# Patient Record
Sex: Female | Born: 1985 | Race: Asian | Hispanic: No | Marital: Married | Smoking: Never smoker
Health system: Southern US, Community
[De-identification: ages and names within clinical notes are randomized; demographics above are authoritative.]

---

## 2010-10-14 ENCOUNTER — Ambulatory Visit (HOSPITAL_COMMUNITY): Admission: RE | Admit: 2010-10-14 | Discharge: 2010-10-14 | Payer: Self-pay | Admitting: Obstetrics

## 2010-12-15 ENCOUNTER — Ambulatory Visit (HOSPITAL_COMMUNITY)
Admission: RE | Admit: 2010-12-15 | Discharge: 2010-12-15 | Payer: Self-pay | Source: Home / Self Care | Attending: Obstetrics | Admitting: Obstetrics

## 2011-01-05 ENCOUNTER — Ambulatory Visit (HOSPITAL_COMMUNITY)
Admission: RE | Admit: 2011-01-05 | Discharge: 2011-01-05 | Payer: Self-pay | Source: Home / Self Care | Attending: Obstetrics | Admitting: Obstetrics

## 2011-01-18 ENCOUNTER — Inpatient Hospital Stay (HOSPITAL_COMMUNITY)
Admission: AD | Admit: 2011-01-18 | Discharge: 2011-01-21 | DRG: 775 | Disposition: A | Discharge status: Home / Self Care | Payer: Medicaid Other | Source: Ambulatory Visit | Attending: Obstetrics & Gynecology | Admitting: Obstetrics & Gynecology

## 2011-01-18 DIAGNOSIS — Z22330 Carrier of Group B streptococcus: Secondary | ICD-10-CM

## 2011-01-18 DIAGNOSIS — O99892 Other specified diseases and conditions complicating childbirth: Secondary | ICD-10-CM | POA: Diagnosis present

## 2011-01-18 LAB — CBC
HCT: 35.6 % — ABNORMAL LOW (ref 36.0–46.0)
MCH: 27.5 pg (ref 26.0–34.0)
MCV: 80.9 fL (ref 78.0–100.0)
Platelets: 255 10*3/uL (ref 150–400)
RBC: 4.4 MIL/uL (ref 3.87–5.11)
RDW: 15.3 % (ref 11.5–15.5)

## 2011-01-20 LAB — CBC
Hemoglobin: 9.3 g/dL — ABNORMAL LOW (ref 12.0–15.0)
MCH: 27.5 pg (ref 26.0–34.0)
MCHC: 32.3 g/dL (ref 30.0–36.0)
MCV: 85.2 fL (ref 78.0–100.0)
RBC: 3.38 MIL/uL — ABNORMAL LOW (ref 3.87–5.11)

## 2011-01-21 ENCOUNTER — Inpatient Hospital Stay (HOSPITAL_COMMUNITY): Admission: AD | Admit: 2011-01-21 | Payer: Self-pay | Source: Home / Self Care | Admitting: Obstetrics

## 2011-01-31 NOTE — H&P (Signed)
  NAMELARISSA, PEGG NO.:  [id number]  MEDICAL RECORD NO.:  [id number]           PATIENT TYPE:  I  LOCATION:  9169                          FACILITY:  [facility]  PHYSICIAN:  Roseanna Rainbow, M.D.DATE OF BIRTH:  July 28, 1986  DATE OF ADMISSION:  01/18/2011 DATE OF DISCHARGE:                             HISTORY & PHYSICAL   CHIEF COMPLAINT:  The patient is a 25 year old gravida 1, para 0 with an estimated date of confinement of January 16, 2011 complaining of passing her mucus plug.  HISTORY OF PRESENT ILLNESS:  Please see the above.  ALLERGIES:  No known drug allergies.  PRENATAL LABORATORY DATA:  GBS positive on December 20, 2010.  Chlamydia probe negative.  Pap smear negative.  GC probe negative.  Sickle cell negative.  Hepatitis B surface antigen negative.  HIV nonreactive.  RPR nonreactive.  Rubella immune.  Blood type is O+.  Antibody screen negative.  Urine culture and sensitivity, greater than 100,000 colonies/mL, enterococcus on October 14, 2010.  Two-hour GTT normal. Hemoglobin 11.5, hematocrit 36.5, and platelets 285,000.  Ultrasound on October 14, 2010, the estimated fetal weight was at the 46th percentile for 26 weeks 1 day gestation.  Ultrasound on December 15, 2010, normal amniotic fluid index.  The estimated fetal weight was at the 41st percentile for a 35-week gestation.  Normal interval anatomy.  Umbilical artery Dopplers were normal.  BPP 8/8.  An ultrasound on January 05, 2011, normal amniotic fluid index.  The estimated fetal weight was at the 59th percentile for a 38-week gestation.  ALLERGIES:  No known drug allergies.  MEDICATIONS:  Please see the medication reconciliation form.  OB RISK FACTORS:  Urinary tract infection.  PAST GYN HISTORY:  Noncontributory.  PAST MEDICAL HISTORY:  No significant history of medical diseases.  PAST SURGICAL HISTORY:  No previous surgeries.  SOCIAL HISTORY:  She is a Art therapist.  She is  married, living with her spouse.  Not using alcohol, formally a minimal user.  Has no significant smoking history.  Denies illicit drug use.  FAMILY HISTORY:  Remarkable for adult-onset diabetes and hypertension.  REVIEW OF SYSTEMS:  GU:  Please see the above.  PHYSICAL EXAMINATION:  VITAL SIGNS:  Stable, afebrile.  The fetal heart tracing baseline 140, moderate to increased long-term variability, no decelerations, accelerations 15 x 15 noted.  Tocodynamometer, irregular uterine contractions. GENERAL:  No apparent distress. ABDOMEN:  Gravid. GENITOURINARY:  Sterile vaginal exam per the RN, the cervix is 6 cm dilated.  ASSESSMENT:  Nullipara at term, latent labor.  Category 1 fetal heart tracing.  GBS positive.  PLAN:  Admission, penicillin for GBS prophylaxis, monitor progress, possible augmentation of labor with Pitocin.     Roseanna Rainbow, M.D.     Judee Clara  D:  01/18/2011  T:  01/18/2011  Job:  [id number]  Electronically Signed by Antionette Char M.D. on 01/22/2011 10:27:04 AM

## 2011-07-20 ENCOUNTER — Ambulatory Visit
Admission: RE | Admit: 2011-07-20 | Discharge: 2011-07-20 | Disposition: A | Discharge status: Home / Self Care | Payer: Self-pay | Source: Ambulatory Visit | Attending: Infectious Diseases | Admitting: Infectious Diseases

## 2011-07-20 ENCOUNTER — Other Ambulatory Visit: Payer: Self-pay | Admitting: Infectious Diseases

## 2011-07-20 DIAGNOSIS — R7611 Nonspecific reaction to tuberculin skin test without active tuberculosis: Secondary | ICD-10-CM

## 2015-04-29 ENCOUNTER — Ambulatory Visit: Payer: Self-pay | Admitting: Obstetrics

## 2016-08-02 ENCOUNTER — Other Ambulatory Visit: Payer: Self-pay | Admitting: "Women's Health Care

## 2016-08-02 DIAGNOSIS — N644 Mastodynia: Secondary | ICD-10-CM

## 2016-08-07 ENCOUNTER — Ambulatory Visit
Admission: RE | Admit: 2016-08-07 | Discharge: 2016-08-07 | Disposition: A | Discharge status: Home / Self Care | Payer: Medicaid Other | Source: Ambulatory Visit | Attending: "Women's Health Care | Admitting: "Women's Health Care

## 2016-08-07 DIAGNOSIS — N644 Mastodynia: Secondary | ICD-10-CM

## 2016-11-16 ENCOUNTER — Other Ambulatory Visit (HOSPITAL_COMMUNITY): Payer: Self-pay | Admitting: Internal Medicine

## 2016-11-16 DIAGNOSIS — G44311 Acute post-traumatic headache, intractable: Secondary | ICD-10-CM

## 2016-11-24 ENCOUNTER — Ambulatory Visit (HOSPITAL_COMMUNITY): Payer: Medicaid Other

## 2016-12-01 ENCOUNTER — Ambulatory Visit (HOSPITAL_COMMUNITY)
Admission: RE | Admit: 2016-12-01 | Discharge: 2016-12-01 | Disposition: A | Discharge status: Home / Self Care | Payer: Medicaid Other | Source: Ambulatory Visit | Attending: Internal Medicine | Admitting: Internal Medicine

## 2016-12-01 DIAGNOSIS — G44311 Acute post-traumatic headache, intractable: Secondary | ICD-10-CM | POA: Diagnosis present

## 2016-12-07 ENCOUNTER — Emergency Department (HOSPITAL_COMMUNITY)
Admission: EM | Admit: 2016-12-07 | Discharge: 2016-12-07 | Disposition: A | Discharge status: Home / Self Care | Payer: Medicaid Other | Attending: Emergency Medicine | Admitting: Emergency Medicine

## 2016-12-07 ENCOUNTER — Emergency Department (HOSPITAL_COMMUNITY): Payer: Medicaid Other

## 2016-12-07 ENCOUNTER — Encounter (HOSPITAL_COMMUNITY): Payer: Self-pay

## 2016-12-07 DIAGNOSIS — R1310 Dysphagia, unspecified: Secondary | ICD-10-CM | POA: Diagnosis present

## 2016-12-07 DIAGNOSIS — Z79899 Other long term (current) drug therapy: Secondary | ICD-10-CM | POA: Insufficient documentation

## 2016-12-07 LAB — BASIC METABOLIC PANEL
Anion gap: 10 (ref 5–15)
BUN: 8 mg/dL (ref 6–20)
CALCIUM: 9.6 mg/dL (ref 8.9–10.3)
CO2: 24 mmol/L (ref 22–32)
Chloride: 104 mmol/L (ref 101–111)
Creatinine, Ser: 0.82 mg/dL (ref 0.44–1.00)
GFR calc Af Amer: >60 mL/min (ref >60)
GLUCOSE: 87 mg/dL (ref 65–99)
Potassium: 4.5 mmol/L (ref 3.5–5.1)
Sodium: 138 mmol/L (ref 135–145)

## 2016-12-07 LAB — CBC WITH DIFFERENTIAL/PLATELET
BASOS ABS: 0 10*3/uL (ref 0.0–0.1)
BASOS PCT: 0 %
EOS PCT: 0 %
Eosinophils Absolute: 0 10*3/uL (ref 0.0–0.7)
HCT: 38.2 % (ref 36.0–46.0)
Hemoglobin: 13.1 g/dL (ref 12.0–15.0)
LYMPHS PCT: 37 %
Lymphs Abs: 1.9 10*3/uL (ref 0.7–4.0)
MCH: 28.7 pg (ref 26.0–34.0)
MCHC: 34.3 g/dL (ref 30.0–36.0)
MCV: 83.6 fL (ref 78.0–100.0)
MONO ABS: 0.3 10*3/uL (ref 0.1–1.0)
MONOS PCT: 7 %
Neutro Abs: 2.9 10*3/uL (ref 1.7–7.7)
Neutrophils Relative %: 56 %
PLATELETS: 353 10*3/uL (ref 150–400)
RBC: 4.57 MIL/uL (ref 3.87–5.11)
RDW: 12.4 % (ref 11.5–15.5)
WBC: 5.1 10*3/uL (ref 4.0–10.5)

## 2016-12-07 MED ORDER — SODIUM CHLORIDE 0.9 % IV SOLN: Freq: Once | INTRAVENOUS | Status: DC

## 2016-12-07 MED ORDER — SODIUM CHLORIDE 0.9 % IV BOLUS (SEPSIS): 500.0000 mL | Freq: Once | INTRAVENOUS | Status: DC

## 2016-12-07 MED ORDER — SODIUM CHLORIDE 0.9 % IV BOLUS (SEPSIS): 1000.0000 mL | Freq: Once | INTRAVENOUS | Status: DC

## 2016-12-07 MED ORDER — IOPAMIDOL (ISOVUE-300) INJECTION 61%
INTRAVENOUS | Status: AC
  Filled 2016-12-07: qty 75

## 2016-12-07 MED ORDER — IOPAMIDOL (ISOVUE-300) INJECTION 61%: 75.0000 mL | Freq: Once | INTRAVENOUS | Status: DC | PRN

## 2016-12-07 NOTE — ED Provider Notes (Signed)
[facility]-EMERGENCY DEPT Provider Note   CSN: [id number] Arrival date & time: 12/07/16  1312  By signing my name below, I, [name], attest that this documentation has been prepared under the direction and in the presence of Shawn C. Joy, PA-C. Electronically Signed: [name], ED Scribe. 12/07/16. 2:07 PM.   History   Chief Complaint Chief Complaint  Patient presents with  . Dysphagia    HPI HPI Comments: Sherri Nguyen is a 30 y.o. female who presents to the Emergency Department complaining of mild dysphagia x 2 weeks. Pt states she was evaluated by her PCP yesterday and was dx with a cyst to her left anterior neck and was told she needs to have a barium swallow test performed. She reports mild non radiating pain in the region. Pt states she is able to swallow liquids w/o difficulty and has mild difficulty swallowing solid foods due to pain. She had a small amount of water this morning but otherwise has had no oral intake since last night. She has taken abx for 10 days which provided mild relief initially but none long term. She denies difficulty breathing, nausea, vomiting, fever, chills or other associated symptoms at this time.   PCP: Dr. Ardelle [facility]Haque at [facility]  The history is provided by the patient and medical records. No language interpreter was used.    History reviewed. No pertinent past medical history.  There are no active problems to display for this patient.   History reviewed. No pertinent surgical history.  OB History    No data available       Home Medications    Prior to Admission medications   Medication Sig Start Date End Date Taking? Authorizing Provider  amoxicillin-clavulanate (AUGMENTIN) 875-125 MG tablet Take 1 tablet by mouth 2 (two) times daily.   Yes Historical Provider, MD  Cholecalciferol (VITAMIN D-3) 5000 units TABS Take 5,000 Units by mouth daily.   Yes Historical Provider, MD  nystatin (MYCOSTATIN) 100000 UNIT/ML  suspension Take 4 mLs by mouth 4 (four) times daily.   Yes Historical Provider, MD  omeprazole (PRILOSEC) 20 MG capsule Take 20 mg by mouth daily as needed (heartburn).   Yes Historical Provider, MD  sulfamethoxazole-trimethoprim (BACTRIM DS,SEPTRA DS) 800-160 MG tablet Take 1 tablet by mouth 2 (two) times daily.   Yes Historical Provider, MD    Family History History reviewed. No pertinent family history.  Social History Social History  Substance Use Topics  . Smoking status: Never Smoker  . Smokeless tobacco: Never Used  . Alcohol use No     Allergies   Patient has no known allergies.   Review of Systems Review of Systems  Constitutional: Negative for chills and fever.  HENT: Positive for sore throat and trouble swallowing.   Respiratory: Negative for shortness of breath.   Gastrointestinal: Negative for nausea and vomiting.  All other systems reviewed and are negative.  Physical Exam Updated Vital Signs BP 122/71 (BP Location: Right Arm)   Pulse 88   Temp 98.2 F (36.8 C) (Oral)   Resp 18   Ht 5\' 6"  (1.676 m)   Wt 148 lb (67.1 kg)   LMP 12/06/2016   SpO2 100%   BMI 23.89 kg/m   Physical Exam  Constitutional: She appears well-developed and well-nourished. No distress.  HENT:  Head: Normocephalic and atraumatic.  Mouth/Throat: Oropharynx is clear and moist.  Oropharynx clear. Speaks in full sentences w/o difficulty. Handles oral secretions w/o difficulty.   Eyes: Conjunctivae are normal.  Neck: Normal range of motion. Neck supple. No tracheal deviation present. No thyromegaly present.  No noted swelling to the soft tissues of the neck. No masses appreciated.  Cardiovascular: Normal rate, regular rhythm, normal heart sounds and intact distal pulses.   Pulmonary/Chest: Effort normal and breath sounds normal. No stridor. No respiratory distress.  Abdominal: There is no guarding.  Musculoskeletal: She exhibits no edema.  Lymphadenopathy:    She has no cervical  adenopathy.  Neurological: She is alert.  Skin: Skin is warm and dry. She is not diaphoretic.  Psychiatric: She has a normal mood and affect. Her behavior is normal.  Nursing note and vitals reviewed.  ED Treatments / Results  Labs (all labs ordered are listed, but only abnormal results are displayed) Labs Reviewed  BASIC METABOLIC PANEL  CBC WITH DIFFERENTIAL/PLATELET    EKG  EKG Interpretation None       Radiology Ct Soft Tissue Neck W Contrast  Result Date: 12/07/2016 CLINICAL DATA:  Dysphagia 2 weeks.  Pain EXAM: CT NECK WITH CONTRAST TECHNIQUE: Multidetector CT imaging of the neck was performed using the standard protocol following the bolus administration of intravenous contrast. CONTRAST:  75 mL Isovue-300 IV COMPARISON:  None. FINDINGS: Pharynx and larynx: Normal pharynx. No mass or edema or abscess. Larynx normal. Salivary glands: No inflammation, mass, or stone. Thyroid: Thyroid normal in size. 5 mm hypodensity right lobe of the thyroid. No mass lesion Lymph nodes: No enlarged or pathologic nodes in the neck. Vascular: Carotid artery and jugular vein enhance normally Limited intracranial: Negative Visualized orbits: Negative Mastoids and visualized paranasal sinuses: Negative Skeleton: Negative Upper chest: Negative Other: None IMPRESSION: No acute abnormality in the neck. No mass or abscess. No significant adenopathy. 5 mm low-density lesion right lobe of the thyroid likely an incidental nodule or cyst. Electronically Signed   By: Marlan Palau M.D.   On: 12/07/2016 16:45    Procedures Procedures  DIAGNOSTIC STUDIES: Oxygen Saturation is 100% on RA, normal by my interpretation.    COORDINATION OF CARE: 2:05 PM Discussed next steps with pt. Pt verbalized understanding and is agreeable with the plan.    Medications Ordered in ED Medications  iopamidol (ISOVUE-300) 61 % injection 75 mL (not administered)  iopamidol (ISOVUE-300) 61 % injection (not administered)    sodium chloride 0.9 % bolus 500 mL (500 mLs Intravenous Not Given 12/07/16 1726)     Initial Impression / Assessment and Plan / ED Course  I have reviewed the triage vital signs and the nursing notes.  Pertinent labs & imaging results that were available during my care of the patient were reviewed by me and considered in my medical decision making (see chart for details).  Clinical Course     Patient presents with reported dysphagia for the last 2 weeks. Patient is nontoxic appearing, afebrile, not tachycardic, not tachypneic, not hypotensive, maintains SPO2 of 100% on room air, and is in no apparent distress. Patient has no signs of sepsis or other serious or life-threatening condition. CT free from immediately significant abnormality. ENT follow-up. Patient already has an ENT appointment scheduled on December 27. Return precautions discussed. Patient voiced understanding of all instructions and is comfortable with discharge.   Vitals:   12/07/16 1321 12/07/16 1323 12/07/16 1543 12/07/16 1751  BP: 122/71  118/79 127/86  Pulse: 88  67 62  Resp: 18  16 16   Temp: 98.2 F (36.8 C)     TempSrc: Oral     SpO2: 100%  100% 100%  Weight:  67.1 kg    Height:  5\' 6"  (1.676 m)      I personally performed the services described in this documentation, which was scribed in my presence. The recorded information has been reviewed and is accurate.  Final Clinical Impressions(s) / ED Diagnoses   Final diagnoses:  Dysphagia, unspecified type    New Prescriptions Discharge Medication List as of 12/07/2016  5:40 PM       Anselm PancoastShawn C Joy, PA-C 12/07/16 2013    Azalia BilisKevin Campos, MD 12/08/16 [phone]

## 2016-12-07 NOTE — Discharge Instructions (Signed)
There were no significant abnormalities noted on the CT scan of the neck, other than a small nodule on the right side of the thyroid. Please follow up with the Ear, Nose, and Throat (ENT) specialist on this matter. Return to the ED should symptoms worsen.

## 2016-12-07 NOTE — ED Triage Notes (Signed)
Pt c/o dysphagia x 2 weeks.  Pain score 6/10.  Pt has been seen by PCP several times for same.  Sts she was told that she has a "cyst" in her throat/neck and has been taking antibiotics x 10 days.  Pt recently had a neck US.  Pt reports that she is supposed to have a barium swallow test scheduled.  NAD noted.  Pt easily speaking full sentences.

## 2016-12-07 NOTE — ED Notes (Signed)
Nurse stated she was collecting the labs and starting an IV

## 2017-12-27 ENCOUNTER — Other Ambulatory Visit: Payer: Self-pay | Admitting: Otolaryngology

## 2017-12-27 DIAGNOSIS — E041 Nontoxic single thyroid nodule: Secondary | ICD-10-CM

## 2018-01-02 ENCOUNTER — Ambulatory Visit
Admission: RE | Admit: 2018-01-02 | Discharge: 2018-01-02 | Disposition: A | Discharge status: Home / Self Care | Payer: Medicaid Other | Source: Ambulatory Visit | Attending: Otolaryngology | Admitting: Otolaryngology

## 2018-01-02 DIAGNOSIS — E041 Nontoxic single thyroid nodule: Secondary | ICD-10-CM

## 2018-09-04 ENCOUNTER — Ambulatory Visit: Payer: Medicaid Other | Admitting: Obstetrics

## 2018-09-18 ENCOUNTER — Ambulatory Visit: Payer: Medicaid Other | Admitting: Obstetrics

## 2018-09-18 ENCOUNTER — Encounter: Payer: Self-pay | Admitting: Obstetrics

## 2018-09-18 VITALS — BP 116/80 | HR 65 | Ht 66.0 in | Wt 170.0 lb

## 2018-09-18 DIAGNOSIS — E782 Mixed hyperlipidemia: Secondary | ICD-10-CM

## 2018-09-18 DIAGNOSIS — B3731 Acute candidiasis of vulva and vagina: Secondary | ICD-10-CM

## 2018-09-18 DIAGNOSIS — B373 Candidiasis of vulva and vagina: Secondary | ICD-10-CM | POA: Diagnosis not present

## 2018-09-18 DIAGNOSIS — R300 Dysuria: Secondary | ICD-10-CM

## 2018-09-18 DIAGNOSIS — R748 Abnormal levels of other serum enzymes: Secondary | ICD-10-CM

## 2018-09-18 DIAGNOSIS — Z0000 Encounter for general adult medical examination without abnormal findings: Secondary | ICD-10-CM

## 2018-09-18 DIAGNOSIS — R7989 Other specified abnormal findings of blood chemistry: Secondary | ICD-10-CM

## 2018-09-18 LAB — POCT URINALYSIS DIPSTICK
Bilirubin, UA: NEGATIVE
Blood, UA: NEGATIVE
Glucose, UA: NEGATIVE
Ketones, UA: NEGATIVE
Leukocytes, UA: NEGATIVE
Nitrite, UA: NEGATIVE
Protein, UA: NEGATIVE
Spec Grav, UA: 1.01 (ref 1.010–1.025)
Urobilinogen, UA: 0.2 E.U./dL
pH, UA: 7 (ref 5.0–8.0)

## 2018-09-18 MED ORDER — FLUCONAZOLE 150 MG PO TABS: 150.0000 mg | ORAL_TABLET | Freq: Once | ORAL | 0 refills | Status: AC

## 2018-09-18 NOTE — Progress Notes (Signed)
Per notes pt here to check hormone levels.  PCP:  Dr. Ardelle Park   LMP:09/01/18 pt states 2 cycles before last cycle were not normal.  Contraception: none  STD Screening: N/A  Last pap: done at PCP  CC: states hormones are imbalanced, notes dizziness, hair shedding, and fatigue. And headaches.   Recently Dx w/ BV by PCP was treated now complains of possible yeast infection.

## 2018-09-18 NOTE — Progress Notes (Signed)
Patient ID: Sherri Nguyen, female   DOB: 13-Aug-1986, 32 y.o.   MRN: [id number]  HPI Sherri Nguyen is a 32 y.o. female.  Patient presents for consult regarding possible hormonal imbalance.  Was seen by PCP and had a battery of extensive labs drawn.  She says she was told that some of her hormones were low and that she needed to see a gynecologist.  She is having normal regular menstrual periods.  He also treated her for BV, and now she thinks that she has a yeast infection.  Also has vaginal pressure with urination. HPI  History reviewed. No pertinent past medical history.  History reviewed. No pertinent surgical history.  Family History  Problem Relation Age of Onset  . Hypertension Mother     Social History Social History   Tobacco Use  . Smoking status: Never Smoker  . Smokeless tobacco: Never Used  Substance Use Topics  . Alcohol use: No  . Drug use: No    No Known Allergies  Current Outpatient Medications  Medication Sig Dispense Refill  . Cholecalciferol (VITAMIN D-3) 5000 units TABS Take 5,000 Units by mouth daily.    Marland Kitchen amoxicillin-clavulanate (AUGMENTIN) 875-125 MG tablet Take 1 tablet by mouth 2 (two) times daily.    . fluconazole (DIFLUCAN) 150 MG tablet Take 1 tablet (150 mg total) by mouth once for 1 dose. 1 tablet 0  . nystatin (MYCOSTATIN) 100000 UNIT/ML suspension Take 4 mLs by mouth 4 (four) times daily.    Marland Kitchen omeprazole (PRILOSEC) 20 MG capsule Take 20 mg by mouth daily as needed (heartburn).    Marland Kitchen sulfamethoxazole-trimethoprim (BACTRIM DS,SEPTRA DS) 800-160 MG tablet Take 1 tablet by mouth 2 (two) times daily.     No current facility-administered medications for this visit.     Review of Systems Review of Systems Constitutional: negative for fatigue and weight loss Respiratory: negative for cough and wheezing Cardiovascular: negative for chest pain, fatigue and palpitations Gastrointestinal: negative for abdominal pain and change in bowel  habits Genitourinary:POSITIVE for white vaginal discharge and pressure with urination Integument/breast: negative for nipple discharge Musculoskeletal:negative for myalgias Neurological: negative for gait problems and tremors Behavioral/Psych: negative for abusive relationship, depression Endocrine: negative for temperature intolerance      Blood pressure 116/80, pulse 65, height 5\' 6"  (1.676 m), weight 170 lb (77.1 kg), last menstrual period 09/01/2018.  Physical Exam Physical Exam:  Deferred  >50% of 15 min visit spent on counseling and coordination of care.   Data Reviewed Labs  Assessment     1. Routine adult health maintenance - healthy female with normal menstrual cycles and normal female hormonal levels  2. Candida vaginitis Rx: - fluconazole (DIFLUCAN) 150 MG tablet; Take 1 tablet (150 mg total) by mouth once for 1 dose.  Dispense: 1 tablet; Refill: 0  3. Dysuria Rx: - POCT Urinalysis Dipstick:  WNL's  4. Elevated liver enzymes - managed by PCP  5. Elevated cholesterol with high triglycerides - managed by PCP  6. Low Vitamin D level - managed by PCP.  She taking Vit D.    Plan    Follow up prn  Orders Placed This Encounter  Procedures  . POCT Urinalysis Dipstick   Meds ordered this encounter  Medications  . fluconazole (DIFLUCAN) 150 MG tablet    Sig: Take 1 tablet (150 mg total) by mouth once for 1 dose.    Dispense:  1 tablet    Refill:  0    Brock Bad MD 09-18-2018

## 2018-10-24 ENCOUNTER — Other Ambulatory Visit: Payer: Self-pay

## 2018-10-24 ENCOUNTER — Encounter (HOSPITAL_COMMUNITY): Payer: Self-pay | Admitting: Emergency Medicine

## 2018-10-24 ENCOUNTER — Emergency Department (HOSPITAL_COMMUNITY)
Admission: EM | Admit: 2018-10-24 | Discharge: 2018-10-24 | Disposition: A | Discharge status: Home / Self Care | Payer: Medicaid Other | Attending: Emergency Medicine | Admitting: Emergency Medicine

## 2018-10-24 DIAGNOSIS — Z79899 Other long term (current) drug therapy: Secondary | ICD-10-CM | POA: Diagnosis not present

## 2018-10-24 DIAGNOSIS — R21 Rash and other nonspecific skin eruption: Secondary | ICD-10-CM | POA: Diagnosis present

## 2018-10-24 DIAGNOSIS — L299 Pruritus, unspecified: Secondary | ICD-10-CM | POA: Diagnosis not present

## 2018-10-24 DIAGNOSIS — R202 Paresthesia of skin: Secondary | ICD-10-CM | POA: Insufficient documentation

## 2018-10-24 MED ORDER — CETIRIZINE HCL 10 MG PO TABS: 10.0000 mg | ORAL_TABLET | Freq: Every day | ORAL | 0 refills | Status: AC

## 2018-10-24 NOTE — Discharge Instructions (Signed)
Take cetirizine daily to help with itching and burning.  You may use over the counter hydrocortisone cream as needed if symptoms are continuing.  Follow up as soon as possible for evaluation if you develop a rash in the same area.  Follow up with your primary care if symptoms do not improve.  Return to the ER with any new, worsening, or concerning symptoms.

## 2018-10-24 NOTE — ED Triage Notes (Signed)
Patient reports burning and itching rash to right upper back x2 days. No rash visible on assessment.

## 2018-10-24 NOTE — ED Provider Notes (Signed)
[facility]-EMERGENCY DEPT Provider Note   CSN: [id number] Arrival date & time: 10/24/18  1628     History   Chief Complaint Chief Complaint  Patient presents with  . Rash    HPI Sherri Nguyen is a 32 y.o. female presenting for evaluation of itching, burning, and irritation.  Patient states that the past 2 to 3 days, she has been having intermittent symptoms of her right upper back.  Nothing brings the symptoms on, nothing makes them go away.  She describes an itching/irritation/burning sensation.  It is not worse with movement.  She denies fall, trauma, or injury.  She denies history of similar.  Symptoms do not cross over into the left side.  Denies fevers, chills, cough, chest pain, shortness breath, nausea, vomiting, abdominal pain, urinary symptoms, abnormal bowel movements.  She has no medical problems, takes vitamins and vitamin D supplementation daily, no other medications.  She denies history of similar.  She denies encounter with somebody with a rash.  She did have chickenpox as a child.  She is not immunocompromise.  HPI  History reviewed. No pertinent past medical history.  There are no active problems to display for this patient.   History reviewed. No pertinent surgical history.   OB History    Gravida  1   Para      Term      Preterm      AB      Living  1     SAB      TAB      Ectopic      Multiple      Live Births               Home Medications    Prior to Admission medications   Medication Sig Start Date End Date Taking? Authorizing Provider  amoxicillin-clavulanate (AUGMENTIN) 875-125 MG tablet Take 1 tablet by mouth 2 (two) times daily.    [provider]  cetirizine (ZYRTEC) 10 MG tablet Take 1 tablet (10 mg total) by mouth daily. 10/24/18   Jaimon Bugaj, PA-C  Cholecalciferol (VITAMIN D-3) 5000 units TABS Take 5,000 Units by mouth daily.    [provider]  nystatin (MYCOSTATIN) 100000  UNIT/ML suspension Take 4 mLs by mouth 4 (four) times daily.    [provider]  omeprazole (PRILOSEC) 20 MG capsule Take 20 mg by mouth daily as needed (heartburn).    [provider]  sulfamethoxazole-trimethoprim (BACTRIM DS,SEPTRA DS) 800-160 MG tablet Take 1 tablet by mouth 2 (two) times daily.    [provider]    Family History Family History  Problem Relation Age of Onset  . Hypertension Mother     Social History Social History   Tobacco Use  . Smoking status: Never Smoker  . Smokeless tobacco: Never Used  Substance Use Topics  . Alcohol use: No  . Drug use: No     Allergies   Patient has no known allergies.   Review of Systems Review of Systems  Skin:       Itching/burning/tingling of R upper back  All other systems reviewed and are negative.    Physical Exam Updated Vital Signs BP (!) 116/93   Pulse 76   Temp 98.4 F (36.9 C) (Oral)   Resp 16   LMP 10/03/2018   SpO2 100%   Physical Exam  Constitutional: She is oriented to person, place, and time. She appears well-developed and well-nourished. No distress.  Sitting in the  bed in no acute distress  HENT:  Head: Normocephalic and atraumatic.  Eyes: Pupils are equal, round, and reactive to light. Conjunctivae and EOM are normal.  Neck: Normal range of motion. Neck supple.  Cardiovascular: Normal rate, regular rhythm and intact distal pulses.  Pulmonary/Chest: Effort normal and breath sounds normal. No respiratory distress. She has no wheezes.      No current symptoms.  Outlined in picture below of where patient has had symptoms.  No rash noted.  No tenderness.   Abdominal: Soft. She exhibits no distension and no mass. There is no tenderness. There is no guarding.  Musculoskeletal: Normal range of motion. She exhibits no edema, tenderness or deformity.  No tenderness palpation along midline spine.  Full active range of motion of upper extremity's.  Strength intact  bilaterally.  Sensation intact bilaterally.  Radial pulses intact bilaterally.  No deformity noted on the back.  No rash, tenderness, erythema.  No pain with palpation.  Neurological: She is alert and oriented to person, place, and time. No sensory deficit.  Skin: Skin is warm and dry. Capillary refill takes less than 2 seconds.  Psychiatric: She has a normal mood and affect.  Nursing note and vitals reviewed.    ED Treatments / Results  Labs (all labs ordered are listed, but only abnormal results are displayed) Labs Reviewed - No data to display  EKG None  Radiology No results found.  Procedures Procedures (including critical care time)  Medications Ordered in ED Medications - No data to display   Initial Impression / Assessment and Plan / ED Course  I have reviewed the triage vital signs and the nursing notes.  Pertinent labs & imaging results that were available during my care of the patient were reviewed by me and considered in my medical decision making (see chart for details).     Patient presenting for evaluation of abnormal sensation of the skin of the right upper back.  Physical exam reassuring, she is afebrile not tachycardic.  Appears nontoxic.  No tenderness to palpation over back or midline spine, no fall or injury.  Low suspicion for vertebral etiology or nerve impingement.  No rash currently, although consider prodromal herpetic rash.  However, patient is not immunocompromised, and patien'st reported area of sensation overlies more than 1 dermatome.  No fevers, chills, nausea, vomiting, or abdominal pain, doubt cholecystitis.  No fevers, chills, cough, doubt pneumonia.  Discussed symptomatic treatment with cetirizine.  Offered treatment for possible zoster, patient declined.  Patient encouraged to follow-up immediately if she develops rash in this area.  Patient follow-up with her PCP if symptoms do not improve.  At this time, patient appears safe for discharge.   Return precautions given.  Patient states she understands and agrees to plan.    Final Clinical Impressions(s) / ED Diagnoses   Final diagnoses:  Itching  Tingling    ED Discharge Orders         Ordered    cetirizine (ZYRTEC) 10 MG tablet  Daily     10/24/18 1659           Alda Gaultney, PA-C 10/24/18 1724    Charlynne Pander, MD 10/24/18 [phone]

## 2019-12-30 ENCOUNTER — Ambulatory Visit: Payer: Medicaid Other | Attending: Internal Medicine

## 2020-04-24 ENCOUNTER — Ambulatory Visit: Payer: Medicaid Other | Attending: Internal Medicine

## 2020-04-24 DIAGNOSIS — Z23 Encounter for immunization: Secondary | ICD-10-CM

## 2020-04-24 NOTE — Progress Notes (Signed)
   Covid-19 Vaccination Clinic  Name:  Sherri Nguyen    MRN: [id number] DOB: Apr 18, 1986  04/24/2020  [name] was observed post Covid-19 immunization for 15 minutes without incident. She was provided with Vaccine Information Sheet and instruction to access the V-Safe system.   [name] was instructed to call 911 with any severe reactions post vaccine: Marland Kitchen Difficulty breathing  . Swelling of face and throat  . A fast heartbeat  . A bad rash all over body  . Dizziness and weakness   Immunizations Administered    Name Date Dose VIS Date Route   Pfizer COVID-19 Vaccine 04/24/2020 10:20 AM 0.3 mL 02/11/2019 Intramuscular   Manufacturer: [employer], [employer]   Lot: [id number]   NDC: [address]-2

## 2020-05-18 ENCOUNTER — Ambulatory Visit: Payer: Medicaid Other

## 2020-05-20 ENCOUNTER — Ambulatory Visit: Payer: Medicaid Other

## 2021-03-02 ENCOUNTER — Ambulatory Visit (INDEPENDENT_AMBULATORY_CARE_PROVIDER_SITE_OTHER): Payer: Medicaid Other | Admitting: Obstetrics

## 2021-03-02 ENCOUNTER — Other Ambulatory Visit (HOSPITAL_COMMUNITY)
Admission: RE | Admit: 2021-03-02 | Discharge: 2021-03-02 | Disposition: A | Discharge status: Home / Self Care | Payer: Medicaid Other | Source: Ambulatory Visit | Attending: Obstetrics | Admitting: Obstetrics

## 2021-03-02 ENCOUNTER — Other Ambulatory Visit: Payer: Self-pay

## 2021-03-02 ENCOUNTER — Encounter: Payer: Self-pay | Admitting: Obstetrics

## 2021-03-02 VITALS — BP 136/97 | HR 67 | Ht 66.0 in | Wt 175.0 lb

## 2021-03-02 DIAGNOSIS — N898 Other specified noninflammatory disorders of vagina: Secondary | ICD-10-CM | POA: Diagnosis present

## 2021-03-02 DIAGNOSIS — Z01419 Encounter for gynecological examination (general) (routine) without abnormal findings: Secondary | ICD-10-CM | POA: Diagnosis present

## 2021-03-02 DIAGNOSIS — N939 Abnormal uterine and vaginal bleeding, unspecified: Secondary | ICD-10-CM | POA: Diagnosis not present

## 2021-03-02 MED ORDER — ORTHO-NOVUM 1/35 (28) 1-35 MG-MCG PO TABS: 1.0000 | ORAL_TABLET | Freq: Every day | ORAL | 11 refills | Status: DC

## 2021-03-02 NOTE — Progress Notes (Signed)
Subjective:        Sherri Nguyen is a 35 y.o. female here for a routine exam.  Current complaints: Prolonged period.  Period has lasted over 2 weeks.  The period was normal until February.  Denies cramping or dysuria.  Does have vaginal discharge.  Personal health questionnaire:  Is patient [name], have a family history of breast and/or ovarian cancer: no Is there a family history of uterine cancer diagnosed at age < 32, gastrointestinal cancer, urinary tract cancer, family member who is a Personnel officer syndrome-associated carrier: no Is the patient overweight and hypertensive, family history of diabetes, personal history of gestational diabetes, preeclampsia or PCOS: no Is patient over 83, have PCOS,  family history of premature CHD under age 38, diabetes, smoke, have hypertension or peripheral artery disease:  no At any time, has a partner hit, kicked or otherwise hurt or frightened you?: no Over the past 2 weeks, have you felt down, depressed or hopeless?: no Over the past 2 weeks, have you felt little interest or pleasure in doing things?:no   Gynecologic History No LMP recorded. Contraception: none Last Pap: 2019. Results were: normal Last mammogram: n/a. Results were: n/a  Obstetric History OB History  Gravida Para Term Preterm AB Living  1         1  SAB IAB Ectopic Multiple Live Births               # Outcome Date GA Lbr Len/2nd Weight Sex Delivery Anes PTL Lv  1 Gravida             History reviewed. No pertinent past medical history.  History reviewed. No pertinent surgical history.   Current Outpatient Medications:  .  norethindrone-ethinyl estradiol 1/35 (ORTHO-NOVUM 1/35, 28,) tablet, Take 1 tablet by mouth daily., Disp: 28 tablet, Rfl: 11 .  amoxicillin-clavulanate (AUGMENTIN) 875-125 MG tablet, Take 1 tablet by mouth 2 (two) times daily. (Patient not taking: Reported on 03/02/2021), Disp: , Rfl:  .  cetirizine (ZYRTEC) 10 MG tablet, Take 1 tablet (10 mg  total) by mouth daily. (Patient not taking: Reported on 03/02/2021), Disp: 30 tablet, Rfl: 0 .  Cholecalciferol (VITAMIN D-3) 5000 units TABS, Take 5,000 Units by mouth daily. (Patient not taking: Reported on 03/02/2021), Disp: , Rfl:  .  nystatin (MYCOSTATIN) 100000 UNIT/ML suspension, Take 4 mLs by mouth 4 (four) times daily. (Patient not taking: Reported on 03/02/2021), Disp: , Rfl:  .  omeprazole (PRILOSEC) 20 MG capsule, Take 20 mg by mouth daily as needed (heartburn). (Patient not taking: Reported on 03/02/2021), Disp: , Rfl:  .  sulfamethoxazole-trimethoprim (BACTRIM DS,SEPTRA DS) 800-160 MG tablet, Take 1 tablet by mouth 2 (two) times daily. (Patient not taking: Reported on 03/02/2021), Disp: , Rfl:  No Known Allergies  Social History   Tobacco Use  . Smoking status: Never Smoker  . Smokeless tobacco: Never Used  Substance Use Topics  . Alcohol use: No    Family History  Problem Relation Age of Onset  . Hypertension Mother       Review of Systems  Constitutional: negative for fatigue and weight loss Respiratory: negative for cough and wheezing Cardiovascular: negative for chest pain, fatigue and palpitations Gastrointestinal: negative for abdominal pain and change in bowel habits Musculoskeletal:negative for myalgias Neurological: negative for gait problems and tremors Behavioral/Psych: negative for abusive relationship, depression Endocrine: negative for temperature intolerance    Genitourinary: positive for abnormal menstrual periods.  Negative for vaginal discharge genital lesions, hot flashes, sexual  problems  Integument/breast: negative for breast lump, breast tenderness, nipple discharge and skin lesion(s)    Objective:       BP (!) 136/97   Pulse 67   Ht 5\' 6"  (1.676 m)   Wt 175 lb (79.4 kg)   BMI 28.25 kg/m  General:   alert and no distress  Skin:   no rash or abnormalities  Lungs:   clear to auscultation bilaterally  Heart:   regular rate and rhythm, S1, S2  normal, no murmur, click, rub or gallop  Breasts:   normal without suspicious masses, skin or nipple changes or axillary nodes  Abdomen:  normal findings: no organomegaly, soft, non-tender and no hernia  Pelvis:  External genitalia: normal general appearance Urinary system: urethral meatus normal and bladder without fullness, nontender Vaginal: normal without tenderness, induration or masses Cervix: normal appearance Adnexa: normal bimanual exam Uterus: anteverted and non-tender, normal size   Lab Review Urine pregnancy test Labs reviewed yes Radiologic studies reviewed no  50% of 20 min visit spent on counseling and coordination of care.   Assessment:     1. Encounter for gynecological examination with Papanicolaou smear of cervix Rx: - Cytology - PAP( [place])  2. Vaginal discharge Rx: - Cervicovaginal ancillary only( [place])  3. Abnormal uterine bleeding (AUB) Rx: - norethindrone-ethinyl estradiol 1/35 (ORTHO-NOVUM 1/35, 28,) tablet; Take 1 tablet by mouth daily.  Dispense: 28 tablet; Refill: 11    Plan:    Education reviewed: calcium supplements, depression evaluation, low fat, low cholesterol diet, safe sex/STD prevention, self breast exams and weight bearing exercise. Contraception: OCP (estrogen/progesterone). Follow up in: 3 months.   Meds ordered this encounter  Medications  . norethindrone-ethinyl estradiol 1/35 (ORTHO-NOVUM 1/35, 28,) tablet    Sig: Take 1 tablet by mouth daily.    Dispense:  28 tablet    Refill:  11     2/35, MD 03/02/2021 10:24 AM

## 2021-03-02 NOTE — Progress Notes (Signed)
RGYN patient presents for a problem visit today. Pt has not been on birth control over 1 yr now   CC: Irregular prolonged period/ cycle in Jan 2022 was normal.    LMP: 02/12/21 - now still bleeding w/ medium flow.

## 2021-03-03 LAB — CERVICOVAGINAL ANCILLARY ONLY
Bacterial Vaginitis (gardnerella): POSITIVE — AB
Candida Glabrata: NEGATIVE
Candida Vaginitis: NEGATIVE
Chlamydia: NEGATIVE
Comment: NEGATIVE
Comment: NEGATIVE
Comment: NEGATIVE
Comment: NEGATIVE
Comment: NEGATIVE
Comment: NORMAL
Neisseria Gonorrhea: NEGATIVE
Trichomonas: NEGATIVE

## 2021-03-04 ENCOUNTER — Other Ambulatory Visit: Payer: Self-pay | Admitting: Obstetrics

## 2021-03-04 DIAGNOSIS — N760 Acute vaginitis: Secondary | ICD-10-CM

## 2021-03-04 DIAGNOSIS — B9689 Other specified bacterial agents as the cause of diseases classified elsewhere: Secondary | ICD-10-CM

## 2021-03-04 MED ORDER — METRONIDAZOLE 500 MG PO TABS: 500.0000 mg | ORAL_TABLET | Freq: Two times a day (BID) | ORAL | 2 refills | Status: DC

## 2021-03-07 ENCOUNTER — Telehealth: Payer: Self-pay

## 2021-03-07 LAB — CYTOLOGY - PAP
Comment: NEGATIVE
Diagnosis: NEGATIVE
Diagnosis: REACTIVE
High risk HPV: NEGATIVE

## 2021-03-07 NOTE — Telephone Encounter (Signed)
Patient called awaiting response from MD requesting to change BC from Ortho Novum 1/35 to Balcoltra d/t HA's and facial acne. She said she did not experience these side effects while taking Balcoltra. Consulted with MD, pt should not continue BCP if she is experiencing HA's. Pt advised to change to a different birth control method. Pt states she does not want to change to a different method; she requests to stick to pills. Pt aware request to change from Balcoltra to Ortho Novum has been declined by her doctor.

## 2021-03-08 ENCOUNTER — Other Ambulatory Visit: Payer: Self-pay

## 2021-03-08 DIAGNOSIS — B9689 Other specified bacterial agents as the cause of diseases classified elsewhere: Secondary | ICD-10-CM

## 2021-03-08 DIAGNOSIS — N760 Acute vaginitis: Secondary | ICD-10-CM

## 2021-03-08 MED ORDER — METRONIDAZOLE 0.75 % VA GEL: 1.0000 | Freq: Every day | VAGINAL | 5 refills | Status: DC

## 2021-03-15 ENCOUNTER — Other Ambulatory Visit: Payer: Self-pay | Admitting: Obstetrics

## 2021-03-15 DIAGNOSIS — Z3041 Encounter for surveillance of contraceptive pills: Secondary | ICD-10-CM

## 2021-03-15 MED ORDER — BALCOLTRA 0.1-20 MG-MCG(21) PO TABS: 1.0000 | ORAL_TABLET | Freq: Every day | ORAL | 11 refills | Status: AC

## 2021-07-28 ENCOUNTER — Ambulatory Visit: Payer: Medicaid Other

## 2021-08-29 ENCOUNTER — Ambulatory Visit: Payer: Medicaid Other | Admitting: Obstetrics

## 2021-10-10 ENCOUNTER — Ambulatory Visit (INDEPENDENT_AMBULATORY_CARE_PROVIDER_SITE_OTHER): Payer: Medicaid Other | Admitting: *Deleted

## 2021-10-10 ENCOUNTER — Other Ambulatory Visit: Payer: Self-pay

## 2021-10-10 VITALS — BP 146/97 | HR 68

## 2021-10-10 DIAGNOSIS — R300 Dysuria: Secondary | ICD-10-CM

## 2021-10-10 DIAGNOSIS — Z3202 Encounter for pregnancy test, result negative: Secondary | ICD-10-CM | POA: Diagnosis not present

## 2021-10-10 DIAGNOSIS — Z3200 Encounter for pregnancy test, result unknown: Secondary | ICD-10-CM

## 2021-10-10 LAB — POCT URINE PREGNANCY: Preg Test, Ur: NEGATIVE

## 2021-10-10 LAB — POCT URINALYSIS DIPSTICK
Bilirubin, UA: NEGATIVE
Blood, UA: NEGATIVE
Glucose, UA: NEGATIVE
Ketones, UA: NEGATIVE
Leukocytes, UA: NEGATIVE
Nitrite, UA: NEGATIVE
Protein, UA: NEGATIVE
Spec Grav, UA: 1.01 (ref 1.010–1.025)
Urobilinogen, UA: 0.2 E.U./dL
pH, UA: 5 (ref 5.0–8.0)

## 2021-10-10 MED ORDER — PHENAZOPYRIDINE HCL 200 MG PO TABS: 200.0000 mg | ORAL_TABLET | Freq: Three times a day (TID) | ORAL | 1 refills | Status: AC | PRN

## 2021-10-10 NOTE — Progress Notes (Signed)
SUBJECTIVE: Sherri Nguyen is a 35 y.o. female who complains of urinary frequency, urgency and dysuria x 2 days, without fever, chills, or abnormal vaginal discharge or bleeding. Reports bilateral flank pain.  OBJECTIVE: Appears well, in no apparent distress.  Vital signs are normal. Urine dipstick shows negative for all components.    ASSESSMENT: Dysuria  PLAN: Treatment per orders.  Call or return to clinic prn if these symptoms worsen or fail to improve as anticipated. Urine culture and RX for pyridium sent per verbal order Dr. Alysia Penna.  [name] presents today for UPT. She has no unusual complaints. LMP:    OBJECTIVE: Appears well, in no apparent distress.  OB History     Gravida  1   Para      Term      Preterm      AB      Living  1      SAB      IAB      Ectopic      Multiple      Live Births             Home UPT Result: none In-Office UPT result: negative I have reviewed the patient's medical, obstetrical, social, and family histories, and medications.   ASSESSMENT: Negative  PLAN Repeat pregnancy test for continued missed period or other early pregnancy symptoms.

## 2021-10-14 ENCOUNTER — Other Ambulatory Visit: Payer: Self-pay

## 2021-10-14 ENCOUNTER — Ambulatory Visit (INDEPENDENT_AMBULATORY_CARE_PROVIDER_SITE_OTHER): Payer: Medicaid Other | Admitting: Obstetrics

## 2021-10-14 ENCOUNTER — Encounter: Payer: Self-pay | Admitting: Obstetrics

## 2021-10-14 ENCOUNTER — Other Ambulatory Visit (HOSPITAL_COMMUNITY)
Admission: RE | Admit: 2021-10-14 | Discharge: 2021-10-14 | Disposition: A | Discharge status: Home / Self Care | Payer: Medicaid Other | Source: Ambulatory Visit | Attending: Obstetrics | Admitting: Obstetrics

## 2021-10-14 VITALS — BP 132/87 | HR 67 | Ht 66.0 in | Wt 176.0 lb

## 2021-10-14 DIAGNOSIS — N939 Abnormal uterine and vaginal bleeding, unspecified: Secondary | ICD-10-CM | POA: Diagnosis not present

## 2021-10-14 DIAGNOSIS — N898 Other specified noninflammatory disorders of vagina: Secondary | ICD-10-CM

## 2021-10-14 NOTE — Progress Notes (Signed)
Patient ID: Sherri Nguyen, female   DOB: May 27, 1986, 35 y.o.   MRN: [id number]  Chief Complaint  Patient presents with   Gynecologic Exam    HPI Sherri Nguyen is a 35 y.o. female.  Complains of spotting with last period for 5 days.  Also c/o vaginal discharge. HPI  History reviewed. No pertinent past medical history.  History reviewed. No pertinent surgical history.  Family History  Problem Relation Age of Onset   Hypertension Mother     Social History Social History   Tobacco Use   Smoking status: Never   Smokeless tobacco: Never  Vaping Use   Vaping Use: Never used  Substance Use Topics   Alcohol use: No   Drug use: No    No Known Allergies  Current Outpatient Medications  Medication Sig Dispense Refill   amoxicillin-clavulanate (AUGMENTIN) 875-125 MG tablet Take 1 tablet by mouth 2 (two) times daily. (Patient not taking: Reported on 03/02/2021)     cetirizine (ZYRTEC) 10 MG tablet Take 1 tablet (10 mg total) by mouth daily. (Patient not taking: Reported on 03/02/2021) 30 tablet 0   Cholecalciferol (VITAMIN D-3) 5000 units TABS Take 5,000 Units by mouth daily. (Patient not taking: Reported on 03/02/2021)     Levonorgest-Eth Estrad-Fe Bisg (BALCOLTRA) 0.1-20 MG-MCG(21) TABS Take 1 tablet by mouth daily. (Patient not taking: Reported on 10/14/2021) 28 tablet 11   metroNIDAZOLE (FLAGYL) 500 MG tablet Take 1 tablet (500 mg total) by mouth 2 (two) times daily. (Patient not taking: Reported on 10/14/2021) 14 tablet 2   metroNIDAZOLE (METROGEL) 0.75 % vaginal gel Place 1 Applicatorful vaginally at bedtime. Apply one applicatorful to vagina at bedtime for 10 days, then twice a week for 6 months. (Patient not taking: Reported on 10/14/2021) 70 g 5   nystatin (MYCOSTATIN) 100000 UNIT/ML suspension Take 4 mLs by mouth 4 (four) times daily. (Patient not taking: Reported on 03/02/2021)     omeprazole (PRILOSEC) 20 MG capsule Take 20 mg by mouth daily as needed (heartburn). (Patient not  taking: Reported on 03/02/2021)     phenazopyridine (PYRIDIUM) 200 MG tablet Take 1 tablet (200 mg total) by mouth 3 (three) times daily as needed for pain (urethral spasm). (Patient not taking: Reported on 10/14/2021) 10 tablet 1   sulfamethoxazole-trimethoprim (BACTRIM DS,SEPTRA DS) 800-160 MG tablet Take 1 tablet by mouth 2 (two) times daily. (Patient not taking: Reported on 03/02/2021)     No current facility-administered medications for this visit.    Review of Systems Review of Systems Constitutional: negative for fatigue and weight loss Respiratory: negative for cough and wheezing Cardiovascular: negative for chest pain, fatigue and palpitations Gastrointestinal: negative for abdominal pain and change in bowel habits Genitourinary:positive for AUB Integument/breast: negative for nipple discharge Musculoskeletal:negative for myalgias Neurological: negative for gait problems and tremors Behavioral/Psych: negative for abusive relationship, depression Endocrine: negative for temperature intolerance      Blood pressure 132/87, pulse 67, height 5\' 6"  (1.676 m), weight 176 lb (79.8 kg), last menstrual period 09/28/2021.  Physical Exam Physical Exam General:   Alert and no distress  Skin:   no rash or abnormalities  Lungs:   clear to auscultation bilaterally  Heart:   regular rate and rhythm, S1, S2 normal, no murmur, click, rub or gallop  Breasts:   normal without suspicious masses, skin or nipple changes or axillary nodes  Abdomen:  normal findings: no organomegaly, soft, non-tender and no hernia  Pelvis:  External genitalia: normal general appearance Urinary system: urethral meatus normal  and bladder without fullness, nontender Vaginal: normal without tenderness, induration or masses Cervix: normal appearance Adnexa: normal bimanual exam Uterus: anteverted and non-tender, normal size    I have spent a total of 20 minutes of face-to-face and time, excluding clinical staff time,  reviewing notes and preparing to see patient, ordering tests and/or medications, and counseling the patient.   Data Reviewed Labs  Assessment     1. Abnormal uterine bleeding (AUB) Rx: - US PELVIC COMPLETE WITH TRANSVAGINAL; Future  2. Vaginal discharge Rx: - Cervicovaginal ancillary only( [place]) R  :  Plan Follow up in 2 weeks    Orders Placed This Encounter  Procedures   US PELVIC COMPLETE WITH TRANSVAGINAL    Standing Status:   Future    Standing Expiration Date:   10/14/2022    Order Specific Question:   Reason for Exam (SYMPTOM  OR DIAGNOSIS REQUIRED)    Answer:   AUB    Order Specific Question:   Preferred imaging location?    Answer:   [facility]      Brock Bad, MD 10/15/2021 2:04 PM

## 2021-10-14 NOTE — Progress Notes (Addendum)
GYN presents for spotting on last period x 5 days.  LMP 09/28/21.  UPT  done 10/10/21 is Negative.  Denies dysuria, pain, NV.  Last PAP 03/02/2021

## 2021-10-17 LAB — CERVICOVAGINAL ANCILLARY ONLY
Bacterial Vaginitis (gardnerella): POSITIVE — AB
Candida Glabrata: NEGATIVE
Candida Vaginitis: NEGATIVE
Chlamydia: NEGATIVE
Comment: NEGATIVE
Comment: NEGATIVE
Comment: NEGATIVE
Comment: NEGATIVE
Comment: NEGATIVE
Comment: NORMAL
Neisseria Gonorrhea: NEGATIVE
Trichomonas: NEGATIVE

## 2021-10-18 ENCOUNTER — Other Ambulatory Visit: Payer: Self-pay | Admitting: Obstetrics

## 2021-10-18 DIAGNOSIS — N760 Acute vaginitis: Secondary | ICD-10-CM

## 2021-10-18 DIAGNOSIS — B9689 Other specified bacterial agents as the cause of diseases classified elsewhere: Secondary | ICD-10-CM

## 2021-10-18 MED ORDER — METRONIDAZOLE 500 MG PO TABS: 500.0000 mg | ORAL_TABLET | Freq: Two times a day (BID) | ORAL | 2 refills | Status: DC

## 2021-10-20 ENCOUNTER — Ambulatory Visit (HOSPITAL_BASED_OUTPATIENT_CLINIC_OR_DEPARTMENT_OTHER): Payer: Medicaid Other

## 2021-10-20 ENCOUNTER — Other Ambulatory Visit: Payer: Self-pay

## 2021-10-20 DIAGNOSIS — B9689 Other specified bacterial agents as the cause of diseases classified elsewhere: Secondary | ICD-10-CM

## 2021-10-20 DIAGNOSIS — N760 Acute vaginitis: Secondary | ICD-10-CM

## 2021-10-20 MED ORDER — METRONIDAZOLE 0.75 % VA GEL: 1.0000 | Freq: Every day | VAGINAL | 0 refills | Status: DC

## 2021-10-20 NOTE — Progress Notes (Signed)
Pt unable to tolerate oral Flagy, changed Rx to metrogel.

## 2021-10-24 ENCOUNTER — Ambulatory Visit (HOSPITAL_BASED_OUTPATIENT_CLINIC_OR_DEPARTMENT_OTHER)
Admission: RE | Admit: 2021-10-24 | Discharge: 2021-10-24 | Disposition: A | Discharge status: Home / Self Care | Payer: Medicaid Other | Source: Ambulatory Visit | Attending: Obstetrics | Admitting: Obstetrics

## 2021-10-24 ENCOUNTER — Other Ambulatory Visit: Payer: Self-pay

## 2021-10-24 DIAGNOSIS — N939 Abnormal uterine and vaginal bleeding, unspecified: Secondary | ICD-10-CM | POA: Insufficient documentation

## 2021-10-26 ENCOUNTER — Encounter: Payer: Self-pay | Admitting: Obstetrics

## 2021-10-26 ENCOUNTER — Telehealth (INDEPENDENT_AMBULATORY_CARE_PROVIDER_SITE_OTHER): Payer: Medicaid Other | Admitting: Obstetrics

## 2021-10-26 DIAGNOSIS — N939 Abnormal uterine and vaginal bleeding, unspecified: Secondary | ICD-10-CM

## 2021-10-26 NOTE — Progress Notes (Signed)
GYNECOLOGY VIRTUAL VISIT ENCOUNTER NOTE  Provider location: [facility]   Patient location: Home  I connected with Sherri Nguyen on 10/26/21 at  4:10 PM EST by MyChart Video Encounter and verified that I am speaking with the correct person using two identifiers.   I discussed the limitations, risks, security and privacy concerns of performing an evaluation and management service virtually and the availability of in person appointments. I also discussed with the patient that there may be a patient responsible charge related to this service. The patient expressed understanding and agreed to proceed.   History:  Sherri Nguyen is a 35 y.o. G1P0 female being evaluated today for ultrasound results for AUB,. She denies any abnormal vaginal discharge, bleeding, pelvic pain or other concerns.       History reviewed. No pertinent past medical history. History reviewed. No pertinent surgical history. The following portions of the patient's history were reviewed and updated as appropriate: allergies, current medications, past family history, past medical history, past social history, past surgical history and problem list.   Health Maintenance:  Normal pap and negative HRHPV on 03-02-2021.  Marland Kitchen   Review of Systems:  Pertinent items noted in HPI and remainder of comprehensive ROS otherwise negative.  Physical Exam:   General:  Alert, oriented and cooperative. Patient appears to be in no acute distress.  Mental Status: Normal mood and affect. Normal behavior. Normal judgment and thought content.   Respiratory: Normal respiratory effort, no problems with respiration noted  Rest of physical exam deferred due to type of encounter  Labs and Imaging Results for orders placed or performed in visit on 10/14/21 (from the past 336 hour(s))  Cervicovaginal ancillary only( [place])   Collection Time: 10/14/21  9:05 AM  Result Value Ref Range   Neisseria Gonorrhea Negative     Chlamydia Negative    Trichomonas Negative    Bacterial Vaginitis (gardnerella) Positive (A)    Candida Vaginitis Negative    Candida Glabrata Negative    Comment      Normal Reference Range Bacterial Vaginosis - Negative   Comment Normal Reference Range Candida Species - Negative    Comment Normal Reference Range Candida Galbrata - Negative    Comment Normal Reference Range Trichomonas - Negative    Comment Normal Reference Ranger Chlamydia - Negative    Comment      Normal Reference Range Neisseria Gonorrhea - Negative   US PELVIC COMPLETE WITH TRANSVAGINAL  Result Date: 10/24/2021 CLINICAL DATA:  Abnormal uterine bleeding for 1 cycle, LMP 10/23/2021 EXAM: TRANSABDOMINAL AND TRANSVAGINAL ULTRASOUND OF PELVIS TECHNIQUE: Both transabdominal and transvaginal ultrasound examinations of the pelvis were performed. Transabdominal technique was performed for global imaging of the pelvis including uterus, ovaries, adnexal regions, and pelvic cul-de-sac. It was necessary to proceed with endovaginal exam following the transabdominal exam to visualize the ovaries and adnexa. COMPARISON:  None FINDINGS: Uterus Measurements: 7.4 x 4.2 x 4.8 cm = volume: 77 mL. Anteverted. Normal morphology without mass Endometrium Thickness: 9 mm.  No endometrial fluid or mass Right ovary Measurements: 3.5 x 1.4 x 4.7 cm = volume: 11.6 mL. Normal morphology without mass Left ovary Measurements: 3.8 x 1.9 x 3.9 cm = volume: 14.7 mL. Normal morphology without mass Other findings No free pelvic fluid.  No adnexal masses. IMPRESSION: Normal exam. Electronically Signed   By: Ulyses Southward M.D.   On: 10/24/2021 15:44       Assessment and Plan:  1. Abnormal uterine bleeding (AUB) - resolved.  Will follow periods clinically for AUB      I discussed the assessment and treatment plan with the patient. The patient was provided an opportunity to ask questions and all were answered. The patient agreed with the plan and  demonstrated an understanding of the instructions.   The patient was advised to call back or seek an in-person evaluation/go to the ED if the symptoms worsen or if the condition fails to improve as anticipated.  I have spent a total of 15 minutes of non-face-to-face time, excluding clinical staff time, reviewing notes and preparing to see patient, ordering tests and/or medications, and counseling the patient.    Coral Ceo, MD [facility]  10/26/21

## 2021-10-26 NOTE — Progress Notes (Signed)
Menses onset 10/23/21, normal per patient

## 2021-11-07 ENCOUNTER — Other Ambulatory Visit: Payer: Self-pay | Admitting: *Deleted

## 2021-11-07 MED ORDER — FLUCONAZOLE 150 MG PO TABS: 150.0000 mg | ORAL_TABLET | Freq: Once | ORAL | 0 refills | Status: AC

## 2021-11-07 NOTE — Progress Notes (Signed)
Pt request yeast treatment after antibiotic use. Diflucan sent today.

## 2021-11-15 ENCOUNTER — Ambulatory Visit: Payer: Medicaid Other | Admitting: Obstetrics

## 2021-12-13 ENCOUNTER — Ambulatory Visit (INDEPENDENT_AMBULATORY_CARE_PROVIDER_SITE_OTHER): Payer: Medicaid Other | Admitting: *Deleted

## 2021-12-13 ENCOUNTER — Other Ambulatory Visit: Payer: Self-pay

## 2021-12-13 DIAGNOSIS — R399 Unspecified symptoms and signs involving the genitourinary system: Secondary | ICD-10-CM | POA: Diagnosis not present

## 2021-12-13 DIAGNOSIS — N926 Irregular menstruation, unspecified: Secondary | ICD-10-CM

## 2021-12-13 LAB — POCT URINALYSIS DIPSTICK
Bilirubin, UA: NEGATIVE
Glucose, UA: NEGATIVE
Ketones, UA: NEGATIVE
Nitrite, UA: NEGATIVE
Protein, UA: NEGATIVE
Spec Grav, UA: <=1.005 — AB (ref 1.010–1.025)
Urobilinogen, UA: 0.2 E.U./dL
pH, UA: 6 (ref 5.0–8.0)

## 2021-12-13 LAB — POCT URINE PREGNANCY: Preg Test, Ur: NEGATIVE

## 2021-12-13 NOTE — Progress Notes (Signed)
Patient seen and assessed by nursing staff.  Agree with documentation and plan.  

## 2021-12-13 NOTE — Progress Notes (Signed)
Sherri Nguyen presents today for UPT. She has no unusual complaints. Pt also request testing for UTI- no particular complaints.  LMP:10/23/21    OBJECTIVE: Appears well, in no apparent distress.  OB History     Gravida  1   Para      Term      Preterm      AB      Living  1      SAB      IAB      Ectopic      Multiple      Live Births             Home UPT Result: Negative  In-Office UPT result:Negative Udip in office : +RBC,+WBC, all other negative.   ASSESSMENT: Negative pregnancy test  PLAN Advised to repeat test in 2 weeks - if positive make appt to cofirm - If negative make appt with provider. UC to be sent today, advised will treat upon results.

## 2021-12-15 LAB — URINE CULTURE

## 2022-03-14 ENCOUNTER — Ambulatory Visit (INDEPENDENT_AMBULATORY_CARE_PROVIDER_SITE_OTHER): Payer: Medicaid Other | Admitting: Obstetrics

## 2022-03-14 ENCOUNTER — Encounter: Payer: Self-pay | Admitting: Obstetrics

## 2022-03-14 ENCOUNTER — Other Ambulatory Visit (HOSPITAL_COMMUNITY)
Admission: RE | Admit: 2022-03-14 | Discharge: 2022-03-14 | Disposition: A | Discharge status: Home / Self Care | Payer: Medicaid Other | Source: Ambulatory Visit | Attending: Obstetrics | Admitting: Obstetrics

## 2022-03-14 ENCOUNTER — Other Ambulatory Visit: Payer: Self-pay

## 2022-03-14 DIAGNOSIS — N898 Other specified noninflammatory disorders of vagina: Secondary | ICD-10-CM | POA: Diagnosis present

## 2022-03-14 DIAGNOSIS — Z1501 Genetic susceptibility to malignant neoplasm of breast: Secondary | ICD-10-CM

## 2022-03-14 DIAGNOSIS — B9689 Other specified bacterial agents as the cause of diseases classified elsewhere: Secondary | ICD-10-CM

## 2022-03-14 DIAGNOSIS — N760 Acute vaginitis: Secondary | ICD-10-CM

## 2022-03-14 DIAGNOSIS — N926 Irregular menstruation, unspecified: Secondary | ICD-10-CM

## 2022-03-14 DIAGNOSIS — Z01419 Encounter for gynecological examination (general) (routine) without abnormal findings: Secondary | ICD-10-CM | POA: Insufficient documentation

## 2022-03-14 MED ORDER — METRONIDAZOLE 0.75 % VA GEL: 1.0000 | Freq: Every day | VAGINAL | 2 refills | Status: DC

## 2022-03-14 NOTE — Progress Notes (Signed)
Pt presents for vaginal discharge x2-3 weeks.  ?Reports missed period last month and concerned for pregnancy.  ?UPT negative in office today. ?

## 2022-03-14 NOTE — Progress Notes (Signed)
? ?Subjective: ? ? ?  ?  ? Sherri Nguyen is a 36 y.o. female here for a routine exam.  Current complaints: Vaginal discharge.   ? ?Personal health questionnaire:  ?Is patient [name], have a family history of breast and/or ovarian cancer: yes ?Is there a family history of uterine cancer diagnosed at age < 39, gastrointestinal cancer, urinary tract cancer, family member who is a Personnel officer syndrome-associated carrier: no ?Is the patient overweight and hypertensive, family history of diabetes, personal history of gestational diabetes, preeclampsia or PCOS: no ?Is patient over 46, have PCOS,  family history of premature CHD under age 67, diabetes, smoke, have hypertension or peripheral artery disease:  no ?At any time, has a partner hit, kicked or otherwise hurt or frightened you?: no ?Over the past 2 weeks, have you felt down, depressed or hopeless?: no ?Over the past 2 weeks, have you felt little interest or pleasure in doing things?:no ? ? ?Gynecologic History ?No LMP recorded. ?Contraception: none ?Last Pap: 03-02-2021. Results were: normal ?Last mammogram: n/a. Results were: n/a ? ?Obstetric History ?OB History  ?Gravida Para Term Preterm AB Living  ?1         1  ?SAB IAB Ectopic Multiple Live Births  ?           ?  ?# Outcome Date GA Lbr Len/2nd Weight Sex Delivery Anes PTL Lv  ?1 Gravida 01/19/11          ? ? ?History reviewed. No pertinent past medical history.  ?History reviewed. No pertinent surgical history.  ? ?Current Outpatient Medications:  ?  Cholecalciferol (VITAMIN D-3) 5000 units TABS, Take 5,000 Units by mouth daily., Disp: , Rfl:  ?  Cyanocobalamin (B12 FAST DISSOLVE) 5000 MCG TBDP, See admin instructions., Disp: , Rfl:  ?  cetirizine (ZYRTEC) 10 MG tablet, Take 1 tablet (10 mg total) by mouth daily. (Patient not taking: Reported on 03/02/2021), Disp: 30 tablet, Rfl: 0 ?  Levonorgest-Eth Estrad-Fe Bisg (BALCOLTRA) 0.1-20 MG-MCG(21) TABS, Take 1 tablet by mouth daily. (Patient not taking: No sig  reported), Disp: 28 tablet, Rfl: 11 ?  metroNIDAZOLE (METROGEL) 0.75 % vaginal gel, Place 1 Applicatorful vaginally at bedtime. Apply one applicatorful to vagina at bedtime for 10 days, then twice a week for 6 months., Disp: 70 g, Rfl: 2 ?  nystatin (MYCOSTATIN) 100000 UNIT/ML suspension, Take 4 mLs by mouth 4 (four) times daily. (Patient not taking: Reported on 03/02/2021), Disp: , Rfl:  ?  omeprazole (PRILOSEC) 20 MG capsule, Take 20 mg by mouth daily as needed (heartburn). (Patient not taking: Reported on 03/02/2021), Disp: , Rfl:  ?  phenazopyridine (PYRIDIUM) 200 MG tablet, Take 1 tablet (200 mg total) by mouth 3 (three) times daily as needed for pain (urethral spasm). (Patient not taking: Reported on 10/14/2021), Disp: 10 tablet, Rfl: 1 ?Allergies  ?Allergen Reactions  ? Peanut Butter Flavor   ?  Other reaction(s): Unknown  ?  ?Social History  ? ?Tobacco Use  ? Smoking status: Never  ? Smokeless tobacco: Never  ?Substance Use Topics  ? Alcohol use: No  ?  ?Family History  ?Problem Relation Age of Onset  ? Hypertension Mother   ?  ? ? ?Review of Systems ? ?Constitutional: negative for fatigue and weight loss ?Respiratory: negative for cough and wheezing ?Cardiovascular: negative for chest pain, fatigue and palpitations ?Gastrointestinal: negative for abdominal pain and change in bowel habits ?Musculoskeletal:negative for myalgias ?Neurological: negative for gait problems and tremors ?Behavioral/Psych: negative for abusive relationship,  depression ?Endocrine: negative for temperature intolerance    ?Genitourinary:negative for abnormal menstrual periods, genital lesions, hot flashes, sexual problems and vaginal discharge ?Integument/breast: negative for breast lump, breast tenderness, nipple discharge and skin lesion(s) ? ?  ?Objective:  ? ?    ?There were no vitals taken for this visit. ?General:   Alert and no didtress  ?Skin:   no rash or abnormalities  ?Lungs:   clear to auscultation bilaterally  ?Heart:    regular rate and rhythm, S1, S2 normal, no murmur, click, rub or gallop  ?Breasts:   normal without suspicious masses, skin or nipple changes or axillary nodes  ?Abdomen:  normal findings: no organomegaly, soft, non-tender and no hernia  ?Pelvis:  External genitalia: normal general appearance ?Urinary system: urethral meatus normal and bladder without fullness, nontender ?Vaginal: normal without tenderness, induration or masses ?Cervix: normal appearance ?Adnexa: normal bimanual exam ?Uterus: anteverted and non-tender, normal size  ? ?Lab Review ?Urine pregnancy test ?Labs reviewed yes ?Radiologic studies reviewed no ? ?I have spent a total of 20 minutes of face-to-face time, excluding clinical staff time, reviewing notes and preparing to see patient, ordering tests and/or medications, and counseling the patient.  ? ?Assessment:  ? ? 1. Encounter for gynecological examination with Papanicolaou smear of cervix ?Rx: ?- Cytology - PAP( [facility]) ? ?2. Missed period ? ?3. Vaginal discharge ?Rx: ?- Cervicovaginal ancillary only( [facility]) ? ?4. BV (bacterial vaginosis) ?Rx: ?- metroNIDAZOLE (METROGEL) 0.75 % vaginal gel; Place 1 Applicatorful vaginally at bedtime. Apply one applicatorful to vagina at bedtime for 10 days, then twice a week for 6 months.  Dispense: 70 g; Refill: 2 ? ?5. Breast cancer genetic susceptibility ?Rx: ?- MM Digital Screening; Future ?- BRCAssure Comprehensive Panel  ?  ? ?Plan:  ? ? Education reviewed: calcium supplements, depression evaluation, low fat, low cholesterol diet, safe sex/STD prevention, self breast exams, and weight bearing exercise. ?Mammogram ordered. ?Follow up in: 2 weeks.  ? ?Meds ordered this encounter  ?Medications  ? metroNIDAZOLE (METROGEL) 0.75 % vaginal gel  ?  Sig: Place 1 Applicatorful vaginally at bedtime. Apply one applicatorful to vagina at bedtime for 10 days, then twice a week for 6 months.  ?  Dispense:  70 g  ?  Refill:  2  ? ?Orders Placed This Encounter   ?Procedures  ? MM Digital Screening  ?  Standing Status:   Future  ?  Standing Expiration Date:   03/15/2023  ?  Order Specific Question:   Reason for Exam (SYMPTOM  OR DIAGNOSIS REQUIRED)  ?  Answer:   Breast cancer susceptibility  ?  Order Specific Question:   Is the patient pregnant?  ?  Answer:   No  ?  Order Specific Question:   Preferred imaging location?  ?  Answer:   [facility]  ? BRCAssure Comprehensive Panel  ? ? ? Brock Bad, MD ?03/14/2022 7:10 PM  ?

## 2022-03-16 LAB — CERVICOVAGINAL ANCILLARY ONLY
Bacterial Vaginitis (gardnerella): NEGATIVE
Candida Glabrata: NEGATIVE
Candida Vaginitis: NEGATIVE
Chlamydia: NEGATIVE
Comment: NEGATIVE
Comment: NEGATIVE
Comment: NEGATIVE
Comment: NEGATIVE
Comment: NEGATIVE
Comment: NORMAL
Neisseria Gonorrhea: NEGATIVE
Trichomonas: NEGATIVE

## 2022-03-20 LAB — CYTOLOGY - PAP
Comment: NEGATIVE
Diagnosis: NEGATIVE
High risk HPV: NEGATIVE

## 2022-03-28 LAB — BRCASSURE COMPREHENSIVE PANEL

## 2022-04-11 ENCOUNTER — Ambulatory Visit: Payer: Medicaid Other

## 2022-04-18 ENCOUNTER — Ambulatory Visit
Admission: RE | Admit: 2022-04-18 | Discharge: 2022-04-18 | Disposition: A | Discharge status: Home / Self Care | Payer: Medicaid Other | Source: Ambulatory Visit | Attending: Obstetrics | Admitting: Obstetrics

## 2022-04-18 DIAGNOSIS — Z1501 Genetic susceptibility to malignant neoplasm of breast: Secondary | ICD-10-CM

## 2022-04-19 ENCOUNTER — Ambulatory Visit: Payer: Medicaid Other | Admitting: Obstetrics

## 2022-06-29 ENCOUNTER — Ambulatory Visit: Payer: Medicaid Other | Admitting: Obstetrics & Gynecology

## 2022-08-14 ENCOUNTER — Ambulatory Visit: Payer: Medicaid Other | Admitting: Advanced Practice Midwife

## 2022-09-04 ENCOUNTER — Ambulatory Visit: Payer: Medicaid Other | Admitting: Advanced Practice Midwife

## 2023-11-23 IMAGING — [facility] MM DIGITAL SCREENING BILAT W/ TOMO AND CAD
8 series · 8 of 24 positions shown · non-contrast
Comparison: Mammogram and ultrasound dated 08/07/2016.

CLINICAL DATA: Screening. The patient's mother is reportedly BRCA
positive.

EXAM:
DIGITAL SCREENING BILATERAL MAMMOGRAM WITH TOMOSYNTHESIS AND CAD
TECHNIQUE: Bilateral screening digital craniocaudal and mediolateral oblique
mammograms were obtained. Bilateral screening digital breast
tomosynthesis was performed. The images were evaluated with
computer-aided detection.

[R CC synth-2D]
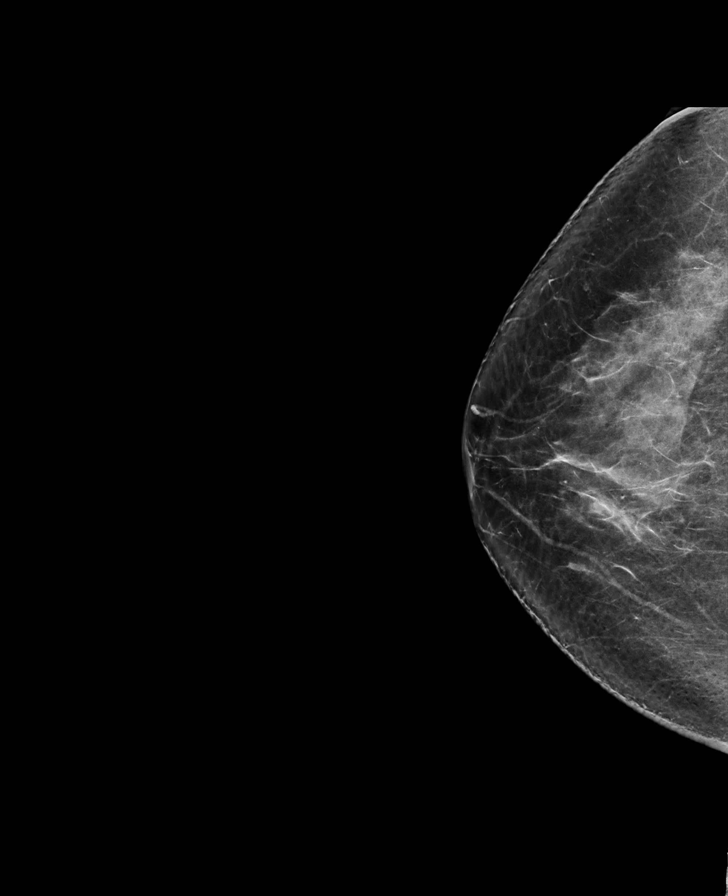

[L CC synth-2D]
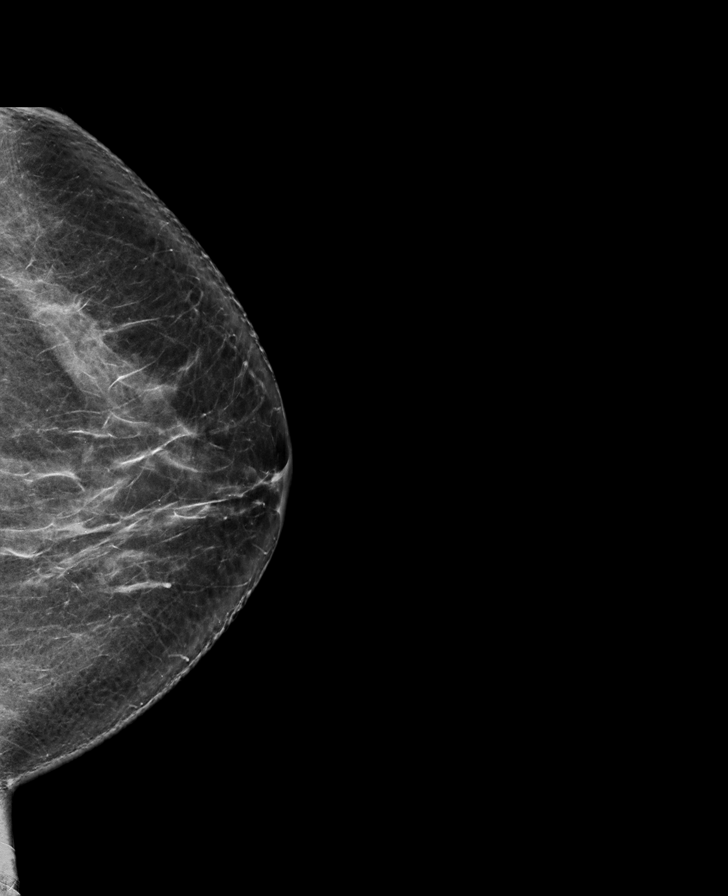

[R MLO synth-2D]
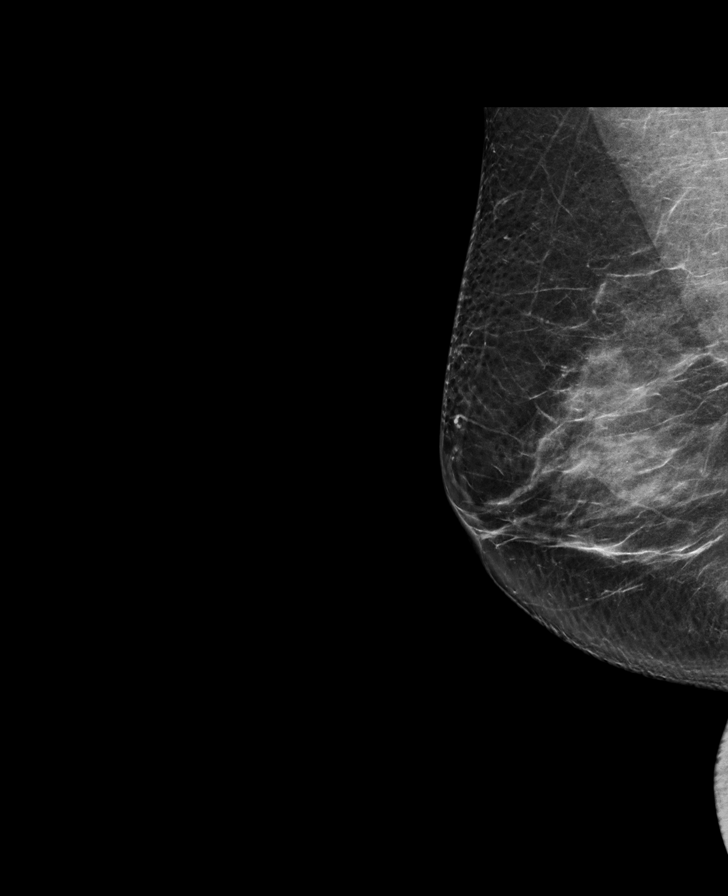

[L MLO synth-2D]
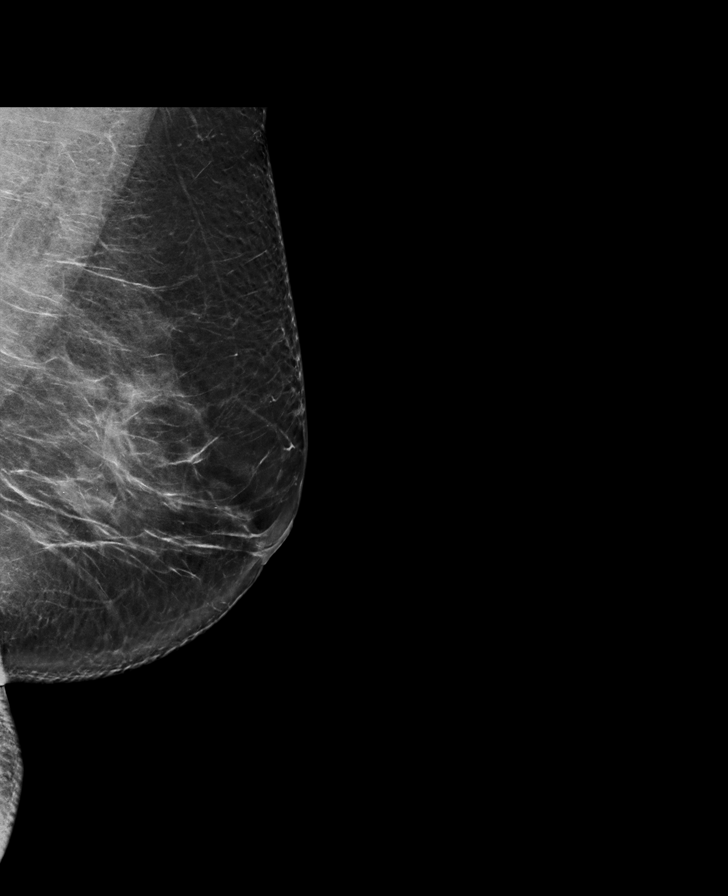

[R MLO tomo · tomo slice 37/73.0]
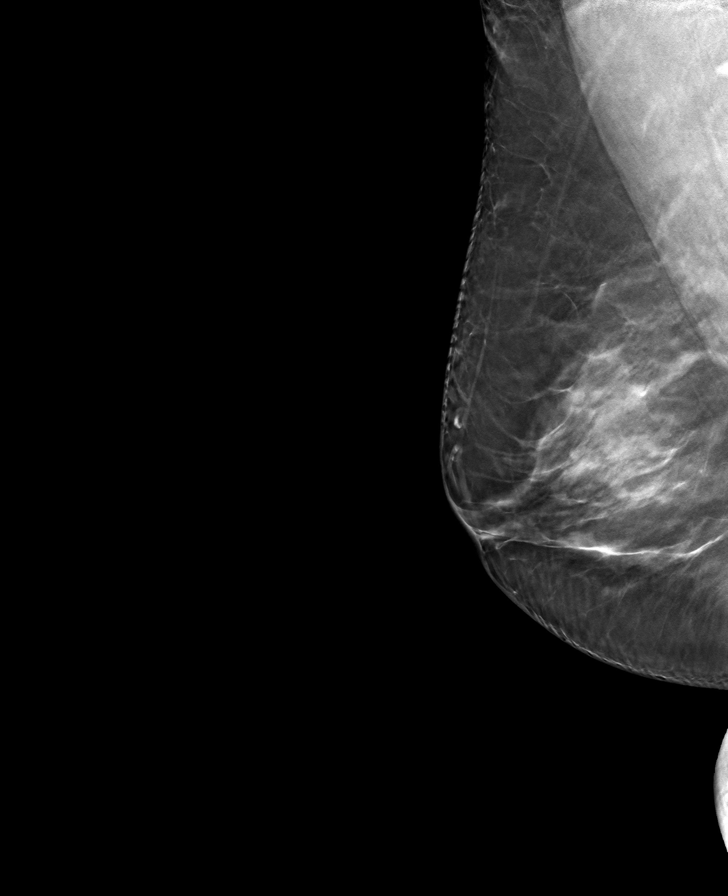

[L MLO tomo · tomo slice 42/83.0]
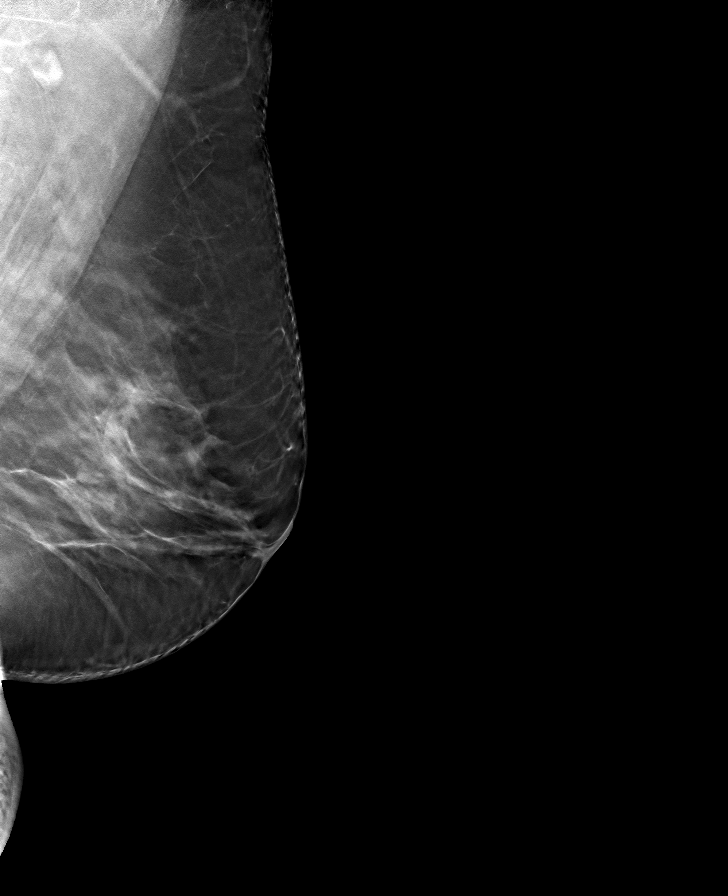

[R CC tomo · tomo slice 37/74.0]
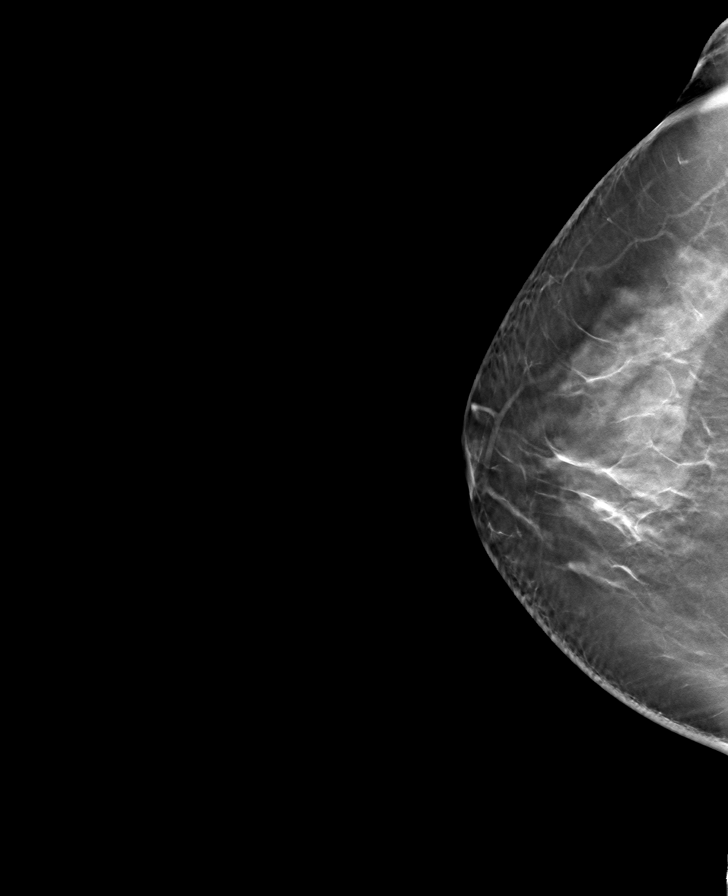

[L CC tomo · tomo slice 39/78.0]
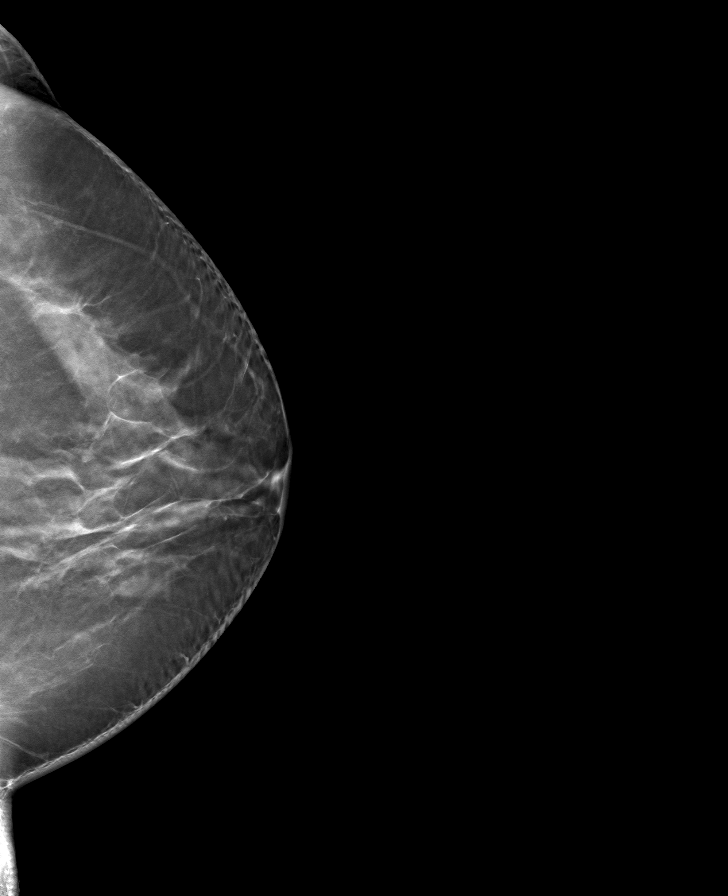

[8 of 24 positions shown; findings below may reference images not displayed]

ACR Breast Density Category b: There are scattered areas of
fibroglandular density.
FINDINGS: There are no findings suspicious for malignancy.
IMPRESSION: No mammographic evidence of malignancy. A result letter of this
screening mammogram will be mailed directly to the patient.

RECOMMENDATION:
Return to annual screening mammogram starting no later than age 40.

BI-RADS CATEGORY  1: Negative.

## 2023-12-13 ENCOUNTER — Ambulatory Visit: Payer: Medicaid Other | Admitting: Obstetrics

## 2024-01-07 ENCOUNTER — Ambulatory Visit: Payer: Medicaid Other | Admitting: Skilled Nursing Facility1

## 2024-01-18 ENCOUNTER — Ambulatory Visit: Payer: Medicaid Other | Admitting: Obstetrics & Gynecology

## 2024-02-18 ENCOUNTER — Ambulatory Visit (INDEPENDENT_AMBULATORY_CARE_PROVIDER_SITE_OTHER): Payer: Medicaid Other

## 2024-02-18 ENCOUNTER — Other Ambulatory Visit (HOSPITAL_COMMUNITY)
Admission: RE | Admit: 2024-02-18 | Discharge: 2024-02-18 | Disposition: A | Discharge status: Home / Self Care | Source: Ambulatory Visit | Attending: Obstetrics and Gynecology | Admitting: Obstetrics and Gynecology

## 2024-02-18 VITALS — BP 131/86 | HR 86

## 2024-02-18 DIAGNOSIS — N898 Other specified noninflammatory disorders of vagina: Secondary | ICD-10-CM | POA: Diagnosis present

## 2024-02-18 DIAGNOSIS — R350 Frequency of micturition: Secondary | ICD-10-CM | POA: Diagnosis not present

## 2024-02-18 LAB — POCT URINALYSIS DIPSTICK
Bilirubin, UA: NEGATIVE
Glucose, UA: NEGATIVE
Ketones, UA: NEGATIVE
Leukocytes, UA: NEGATIVE
Nitrite, UA: NEGATIVE
Protein, UA: NEGATIVE
Spec Grav, UA: 1.02 (ref 1.010–1.025)
Urobilinogen, UA: 0.2 U/dL
pH, UA: 6 (ref 5.0–8.0)

## 2024-02-18 NOTE — Progress Notes (Signed)
 SUBJECTIVE: Sherri Nguyen is a 38 y.o. female who complains of urinary frequency, urgency and dysuria x 2 weeks, without flank pain, fever. Pt does report chills and abnormal vaginal discharge along with bleeding. Pt is requesting STD self swab as well.  OBJECTIVE: Appears well, in no apparent distress.  Vital signs are normal. Urine dipstick shows positive for RBC's.    ASSESSMENT: Dysuria  PLAN: Treatment per orders.  Call or return to clinic prn if these symptoms worsen or fail to improve as anticipated.

## 2024-02-19 LAB — CERVICOVAGINAL ANCILLARY ONLY
Bacterial Vaginitis (gardnerella): NEGATIVE
Candida Glabrata: NEGATIVE
Candida Vaginitis: NEGATIVE
Chlamydia: NEGATIVE
Comment: NEGATIVE
Comment: NEGATIVE
Comment: NEGATIVE
Comment: NEGATIVE
Comment: NEGATIVE
Comment: NORMAL
Neisseria Gonorrhea: NEGATIVE
Trichomonas: NEGATIVE

## 2024-02-20 ENCOUNTER — Encounter: Payer: Self-pay | Admitting: Obstetrics and Gynecology

## 2024-02-20 LAB — URINE CULTURE

## 2024-03-13 ENCOUNTER — Ambulatory Visit: Payer: Medicaid Other | Admitting: Obstetrics

## 2024-04-07 ENCOUNTER — Encounter: Payer: Self-pay | Admitting: Obstetrics

## 2024-04-08 ENCOUNTER — Ambulatory Visit: Admitting: Obstetrics

## 2024-04-21 ENCOUNTER — Encounter: Payer: Self-pay | Admitting: Obstetrics

## 2024-04-21 ENCOUNTER — Ambulatory Visit: Admitting: Obstetrics

## 2024-04-21 ENCOUNTER — Other Ambulatory Visit (HOSPITAL_COMMUNITY)
Admission: RE | Admit: 2024-04-21 | Discharge: 2024-04-21 | Disposition: A | Discharge status: Home / Self Care | Source: Ambulatory Visit | Attending: Obstetrics | Admitting: Obstetrics

## 2024-04-21 VITALS — BP 122/86 | HR 63 | Ht 66.0 in | Wt 176.3 lb

## 2024-04-21 DIAGNOSIS — R52 Pain, unspecified: Secondary | ICD-10-CM

## 2024-04-21 DIAGNOSIS — N760 Acute vaginitis: Secondary | ICD-10-CM

## 2024-04-21 DIAGNOSIS — Z01419 Encounter for gynecological examination (general) (routine) without abnormal findings: Secondary | ICD-10-CM | POA: Diagnosis present

## 2024-04-21 DIAGNOSIS — Z1339 Encounter for screening examination for other mental health and behavioral disorders: Secondary | ICD-10-CM

## 2024-04-21 DIAGNOSIS — B9689 Other specified bacterial agents as the cause of diseases classified elsewhere: Secondary | ICD-10-CM

## 2024-04-21 DIAGNOSIS — Z3169 Encounter for other general counseling and advice on procreation: Secondary | ICD-10-CM

## 2024-04-21 DIAGNOSIS — N898 Other specified noninflammatory disorders of vagina: Secondary | ICD-10-CM | POA: Insufficient documentation

## 2024-04-21 DIAGNOSIS — Z113 Encounter for screening for infections with a predominantly sexual mode of transmission: Secondary | ICD-10-CM | POA: Diagnosis not present

## 2024-04-21 DIAGNOSIS — B3731 Acute candidiasis of vulva and vagina: Secondary | ICD-10-CM

## 2024-04-21 MED ORDER — IBUPROFEN 800 MG PO TABS: 800.0000 mg | ORAL_TABLET | Freq: Three times a day (TID) | ORAL | 5 refills | Status: AC | PRN

## 2024-04-21 MED ORDER — VITAFOL ULTRA 29-0.6-0.4-200 MG PO CAPS: 1.0000 | ORAL_CAPSULE | Freq: Every day | ORAL | 4 refills | Status: AC

## 2024-04-21 MED ORDER — TERCONAZOLE 0.8 % VA CREA: 1.0000 | TOPICAL_CREAM | Freq: Every day | VAGINAL | 4 refills | Status: DC

## 2024-04-21 MED ORDER — METRONIDAZOLE 0.75 % VA GEL: 1.0000 | Freq: Every day | VAGINAL | 2 refills | Status: DC

## 2024-04-21 NOTE — Progress Notes (Signed)
 Subjective:        Sherri Nguyen is a 38 y.o. female here for a routine exam.  Current complaints: Vaginal discharge.  Yeast infection..    Personal health questionnaire:  Is patient [name], have a family history of breast and/or ovarian cancer: no Is there a family history of uterine cancer diagnosed at age < 59, gastrointestinal cancer, urinary tract cancer, family member who is a Personnel officer syndrome-associated carrier: no Is the patient overweight and hypertensive, family history of diabetes, personal history of gestational diabetes, preeclampsia or PCOS: no Is patient over 42, have PCOS,  family history of premature CHD under age 38, diabetes, smoke, have hypertension or peripheral artery disease:  no At any time, has a partner hit, kicked or otherwise hurt or frightened you?: no Over the past 2 weeks, have you felt down, depressed or hopeless?: no Over the past 2 weeks, have you felt little interest or pleasure in doing things?:no   Gynecologic History No LMP recorded. Contraception: OCP (estrogen/progesterone) Last Pap: 2023. Results were: normal Last mammogram: 2023. Results were: normal  Obstetric History OB History  Gravida Para Term Preterm AB Living  1     1  SAB IAB Ectopic Multiple Live Births          # Outcome Date GA Lbr Len/2nd Weight Sex Type Anes PTL Lv  1 Gravida 01/19/11            History reviewed. No pertinent past medical history.  History reviewed. No pertinent surgical history.   Current Outpatient Medications:    ibuprofen (ADVIL) 800 MG tablet, Take 1 tablet (800 mg total) by mouth every 8 (eight) hours as needed., Disp: 30 tablet, Rfl: 5   Prenat-Fe Poly-Methfol-FA-DHA (VITAFOL ULTRA) 29-0.6-0.4-200 MG CAPS, Take 1 capsule by mouth daily before breakfast., Disp: 34 each, Rfl: 4   terconazole (TERAZOL 3) 0.8 % vaginal cream, Place 1 applicator vaginally at bedtime., Disp: 20 g, Rfl: 4   cetirizine  (ZYRTEC ) 10 MG tablet, Take 1 tablet (10  mg total) by mouth daily. (Patient not taking: Reported on 03/02/2021), Disp: 30 tablet, Rfl: 0   Cholecalciferol (VITAMIN D-3) 5000 units TABS, Take 5,000 Units by mouth daily., Disp: , Rfl:    Cyanocobalamin (B12 FAST DISSOLVE) 5000 MCG TBDP, See admin instructions., Disp: , Rfl:    Levonorgest-Eth Estrad-Fe Bisg (BALCOLTRA ) 0.1-20 MG-MCG(21) TABS, Take 1 tablet by mouth daily. (Patient not taking: Reported on 10/14/2021), Disp: 28 tablet, Rfl: 11   metroNIDAZOLE  (METROGEL ) 0.75 % vaginal gel, Place 1 Applicatorful vaginally at bedtime. Apply one applicatorful to vagina at bedtime for 10 days, then twice a week for 6 months., Disp: 70 g, Rfl: 2   nystatin (MYCOSTATIN) 100000 UNIT/ML suspension, Take 4 mLs by mouth 4 (four) times daily. (Patient not taking: Reported on 03/02/2021), Disp: , Rfl:    omeprazole (PRILOSEC) 20 MG capsule, Take 20 mg by mouth daily as needed (heartburn). (Patient not taking: Reported on 02/18/2024), Disp: , Rfl:    phenazopyridine  (PYRIDIUM ) 200 MG tablet, Take 1 tablet (200 mg total) by mouth 3 (three) times daily as needed for pain (urethral spasm). (Patient not taking: Reported on 10/14/2021), Disp: 10 tablet, Rfl: 1 Allergies  Allergen Reactions   Peanut Butter Flavoring Agent (Non-Screening)     Other reaction(s): Unknown    Social History   Tobacco Use   Smoking status: Never   Smokeless tobacco: Never  Substance Use Topics   Alcohol use: No    Family History  Problem Relation Age of Onset   Breast cancer Mother 63   Hypertension Mother       Review of Systems  Constitutional: negative for fatigue and weight loss Respiratory: negative for cough and wheezing Cardiovascular: negative for chest pain, fatigue and palpitations Gastrointestinal: negative for abdominal pain and change in bowel habits Musculoskeletal:negative for myalgias Neurological: negative for gait problems and tremors Behavioral/Psych: negative for abusive relationship,  depression Endocrine: negative for temperature intolerance    Genitourinary: positive for vaginal discharge.  negative for abnormal menstrual periods, genital lesions, hot flashes, sexual problems  Integument/breast: negative for breast lump, breast tenderness, nipple discharge and skin lesion(s)    Objective:       BP 122/86   Pulse 63   Ht 5\' 6"  (1.676 m)   Wt 176 lb 4.8 oz (80 kg)   BMI 28.46 kg/m  General:   alert  Skin:   no rash or abnormalities  Lungs:   clear to auscultation bilaterally  Heart:   regular rate and rhythm, S1, S2 normal, no murmur, click, rub or gallop  Breasts:   normal without suspicious masses, skin or nipple changes or axillary nodes  Abdomen:  normal findings: no organomegaly, soft, non-tender and no hernia  Pelvis:  External genitalia: normal general appearance Urinary system: urethral meatus normal and bladder without fullness, nontender Vaginal: normal without tenderness, induration or masses Cervix: normal appearance Adnexa: normal bimanual exam Uterus: anteverted and non-tender, normal size   Lab Review Urine pregnancy test Labs reviewed yes Radiologic studies reviewed yes  I have spent a total of 20 minutes of face-to-face time, excluding clinical staff time, reviewing notes and preparing to see patient, ordering tests and/or medications, and counseling the patient.   Assessment:    1. Encounter for gynecological examination with Papanicolaou smear of cervix (Primary) Rx: - Cytology - PAP( [place])  2. Vaginal discharge Rx: - Cervicovaginal ancillary only  3. Screen for STD (sexually transmitted disease) Rx: - Hepatitis B surface antigen - Hepatitis C antibody - RPR - HIV Antibody (routine testing w rflx)  4. Candida vaginitis Rx: - terconazole (TERAZOL 3) 0.8 % vaginal cream; Place 1 applicator vaginally at bedtime.  Dispense: 20 g; Refill: 4  5. BV (bacterial vaginosis) Rx: - metroNIDAZOLE  (METROGEL ) 0.75 % vaginal gel;  Place 1 Applicatorful vaginally at bedtime. Apply one applicatorful to vagina at bedtime for 10 days, then twice a week for 6 months.  Dispense: 70 g; Refill: 2  6. Pain Rx: - ibuprofen (ADVIL) 800 MG tablet; Take 1 tablet (800 mg total) by mouth every 8 (eight) hours as needed.  Dispense: 30 tablet; Refill: 5  7. Encounter for preconception consultation Rx: - Prenat-Fe Poly-Methfol-FA-DHA (VITAFOL ULTRA) 29-0.6-0.4-200 MG CAPS; Take 1 capsule by mouth daily before breakfast.  Dispense: 34 each; Refill: 4     Plan:    Education reviewed: calcium supplements, depression evaluation, low fat, low cholesterol diet, safe sex/STD prevention, self breast exams, and weight bearing exercise. Follow up in: 1 year.   Meds ordered this encounter  Medications   terconazole (TERAZOL 3) 0.8 % vaginal cream    Sig: Place 1 applicator vaginally at bedtime.    Dispense:  20 g    Refill:  4   ibuprofen (ADVIL) 800 MG tablet    Sig: Take 1 tablet (800 mg total) by mouth every 8 (eight) hours as needed.    Dispense:  30 tablet    Refill:  5   metroNIDAZOLE  (METROGEL ) 0.75 %  vaginal gel    Sig: Place 1 Applicatorful vaginally at bedtime. Apply one applicatorful to vagina at bedtime for 10 days, then twice a week for 6 months.    Dispense:  70 g    Refill:  2   Prenat-Fe Poly-Methfol-FA-DHA (VITAFOL ULTRA) 29-0.6-0.4-200 MG CAPS    Sig: Take 1 capsule by mouth daily before breakfast.    Dispense:  34 each    Refill:  4   Orders Placed This Encounter  Procedures   Hepatitis B surface antigen   Hepatitis C antibody   RPR   HIV Antibody (routine testing w rflx)    Gabrielle Joiner, MD, FACOG Attending Obstetrician & Gynecologist, [facility] for [facility], [facility], [place] 04/21/2024

## 2024-04-22 LAB — CERVICOVAGINAL ANCILLARY ONLY
Bacterial Vaginitis (gardnerella): NEGATIVE
Candida Glabrata: NEGATIVE
Candida Vaginitis: POSITIVE — AB
Chlamydia: NEGATIVE
Comment: NEGATIVE
Comment: NEGATIVE
Comment: NEGATIVE
Comment: NEGATIVE
Comment: NEGATIVE
Comment: NORMAL
Neisseria Gonorrhea: NEGATIVE
Trichomonas: NEGATIVE

## 2024-04-22 LAB — CYTOLOGY - PAP
Comment: NEGATIVE
Diagnosis: NEGATIVE
High risk HPV: NEGATIVE

## 2024-04-23 ENCOUNTER — Other Ambulatory Visit: Payer: Self-pay

## 2024-04-23 ENCOUNTER — Encounter: Payer: Self-pay | Admitting: Obstetrics

## 2024-04-23 LAB — HEPATITIS C ANTIBODY: Hep C Virus Ab: NONREACTIVE

## 2024-04-23 LAB — RPR: RPR Ser Ql: NONREACTIVE

## 2024-04-23 LAB — HIV ANTIBODY (ROUTINE TESTING W REFLEX): HIV Screen 4th Generation wRfx: NONREACTIVE

## 2024-04-23 LAB — HEPATITIS B SURFACE ANTIGEN: Hepatitis B Surface Ag: NEGATIVE

## 2024-04-23 MED ORDER — METRONIDAZOLE 500 MG PO TABS: 500.0000 mg | ORAL_TABLET | Freq: Two times a day (BID) | ORAL | 0 refills | Status: AC

## 2024-04-23 NOTE — Progress Notes (Signed)
 Flagyl  rx sent instead of metrogel  cream per pt request.

## 2024-04-24 ENCOUNTER — Other Ambulatory Visit: Payer: Self-pay

## 2024-04-24 MED ORDER — FLUCONAZOLE 150 MG PO TABS: 150.0000 mg | ORAL_TABLET | Freq: Once | ORAL | 0 refills | Status: DC

## 2024-05-01 ENCOUNTER — Other Ambulatory Visit: Payer: Self-pay

## 2024-05-01 MED ORDER — FLUCONAZOLE 150 MG PO TABS: 150.0000 mg | ORAL_TABLET | Freq: Once | ORAL | 0 refills | Status: AC

## 2024-05-07 ENCOUNTER — Other Ambulatory Visit: Payer: Self-pay

## 2024-05-07 MED ORDER — TERCONAZOLE 0.8 % VA CREA: 1.0000 | TOPICAL_CREAM | Freq: Every day | VAGINAL | 0 refills | Status: AC

## 2024-07-14 ENCOUNTER — Encounter: Payer: Self-pay | Admitting: Obstetrics

## 2024-07-14 ENCOUNTER — Other Ambulatory Visit: Payer: Self-pay

## 2024-07-14 MED ORDER — FLUCONAZOLE 150 MG PO TABS: 150.0000 mg | ORAL_TABLET | ORAL | 0 refills | Status: AC

## 2024-12-09 ENCOUNTER — Ambulatory Visit: Admitting: Obstetrics
